# Patient Record
Sex: Male | Born: 1955 | Race: White | Hispanic: No | Marital: Married | Smoking: Former smoker
Health system: Southern US, Community
[De-identification: ages and names within clinical notes are randomized; demographics above are authoritative.]

## PROBLEM LIST (undated history)

## (undated) DIAGNOSIS — M1990 Unspecified osteoarthritis, unspecified site: Secondary | ICD-10-CM

## (undated) DIAGNOSIS — J9690 Respiratory failure, unspecified, unspecified whether with hypoxia or hypercapnia: Secondary | ICD-10-CM

## (undated) DIAGNOSIS — E119 Type 2 diabetes mellitus without complications: Secondary | ICD-10-CM

## (undated) DIAGNOSIS — R569 Unspecified convulsions: Secondary | ICD-10-CM

## (undated) DIAGNOSIS — K635 Polyp of colon: Secondary | ICD-10-CM

## (undated) DIAGNOSIS — N529 Male erectile dysfunction, unspecified: Secondary | ICD-10-CM

## (undated) DIAGNOSIS — E538 Deficiency of other specified B group vitamins: Secondary | ICD-10-CM

## (undated) DIAGNOSIS — E785 Hyperlipidemia, unspecified: Secondary | ICD-10-CM

## (undated) DIAGNOSIS — E291 Testicular hypofunction: Secondary | ICD-10-CM

## (undated) DIAGNOSIS — N289 Disorder of kidney and ureter, unspecified: Secondary | ICD-10-CM

## (undated) DIAGNOSIS — L0390 Cellulitis, unspecified: Secondary | ICD-10-CM

## (undated) DIAGNOSIS — N189 Chronic kidney disease, unspecified: Secondary | ICD-10-CM

## (undated) DIAGNOSIS — I8393 Asymptomatic varicose veins of bilateral lower extremities: Secondary | ICD-10-CM

## (undated) DIAGNOSIS — D649 Anemia, unspecified: Secondary | ICD-10-CM

## (undated) DIAGNOSIS — J329 Chronic sinusitis, unspecified: Secondary | ICD-10-CM

## (undated) DIAGNOSIS — G4730 Sleep apnea, unspecified: Secondary | ICD-10-CM

## (undated) DIAGNOSIS — H269 Unspecified cataract: Secondary | ICD-10-CM

## (undated) DIAGNOSIS — N281 Cyst of kidney, acquired: Secondary | ICD-10-CM

## (undated) DIAGNOSIS — C801 Malignant (primary) neoplasm, unspecified: Secondary | ICD-10-CM

## (undated) DIAGNOSIS — M6282 Rhabdomyolysis: Secondary | ICD-10-CM

## (undated) DIAGNOSIS — I671 Cerebral aneurysm, nonruptured: Secondary | ICD-10-CM

## (undated) DIAGNOSIS — I10 Essential (primary) hypertension: Secondary | ICD-10-CM

## (undated) HISTORY — DX: Type 2 diabetes mellitus without complications: E11.9

## (undated) HISTORY — DX: Essential (primary) hypertension: I10

## (undated) HISTORY — PX: CEREBRAL ANGIOGRAM: SHX1326

---
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
## 1996-11-03 HISTORY — PX: PILONIDAL CYST EXCISION: SHX744

## 2005-12-16 ENCOUNTER — Encounter: Admission: RE | Admit: 2005-12-16 | Discharge: 2005-12-16 | Payer: Self-pay | Admitting: Internal Medicine

## 2012-08-24 ENCOUNTER — Other Ambulatory Visit: Payer: Self-pay

## 2012-08-24 DIAGNOSIS — I872 Venous insufficiency (chronic) (peripheral): Secondary | ICD-10-CM

## 2012-09-03 ENCOUNTER — Encounter: Payer: Self-pay | Admitting: Vascular Surgery

## 2012-09-06 ENCOUNTER — Ambulatory Visit (INDEPENDENT_AMBULATORY_CARE_PROVIDER_SITE_OTHER): Payer: BC Managed Care – PPO | Admitting: Vascular Surgery

## 2012-09-06 ENCOUNTER — Encounter: Payer: Self-pay | Admitting: Vascular Surgery

## 2012-09-06 ENCOUNTER — Encounter (INDEPENDENT_AMBULATORY_CARE_PROVIDER_SITE_OTHER): Payer: BC Managed Care – PPO | Admitting: *Deleted

## 2012-09-06 VITALS — BP 144/79 | HR 66 | Resp 16 | Ht 72.0 in | Wt 336.0 lb

## 2012-09-06 DIAGNOSIS — I83893 Varicose veins of bilateral lower extremities with other complications: Secondary | ICD-10-CM

## 2012-09-06 DIAGNOSIS — I872 Venous insufficiency (chronic) (peripheral): Secondary | ICD-10-CM

## 2012-09-06 NOTE — Progress Notes (Signed)
Subjective:     Patient ID: Lawrence [name], male   DOB: [birth year], 56 y.o.   MRN: [id number]  HPI this 56 year old male is referred for severe venous insufficiency of both lower extremities. He has had swelling and darkening of the skin over the past 7 or 8 years with a history of at least 3 stasis ulcers in the left lower leg. He had laser ablation performed in [place] in 2008 and the most recent ulcer healed. He has continued to have progression of severe dark thick skin in the left leg and also in the right leg but to a slightly lesser degree. He has chronic swelling in both ankles. He does not elevate his legs at night nor were stockings during the day. He has no history of DVT, thrombophlebitis, bleeding, or bulging varicosities. He has been having aching, throbbing, and burning discomfort in both legs as the day progresses this worsens. He is very concerned about the progression of his skin changes.  Past Medical History  Diagnosis Date  . Hypertension   . Diabetes mellitus without complication     History  Substance Use Topics  . Smoking status: Former Smoker    Quit date: 12/04/2004  . Smokeless tobacco: Not on file  . Alcohol Use: No    Family History  Problem Relation Age of Onset  . Cancer Father   . Hyperlipidemia Brother     No Known Allergies  Current outpatient prescriptions:amoxicillin-clavulanate (AUGMENTIN) 875-125 MG per tablet, , Disp: , Rfl: ;  Blood Glucose Monitoring Suppl (ONE TOUCH ULTRA 2) W/DEVICE KIT, , Disp: , Rfl: ;  cyanocobalamin (,VITAMIN B-12,) 1000 MCG/ML injection, , Disp: , Rfl: ;  fenofibrate 160 MG tablet, , Disp: , Rfl: ;  Lancets (ONETOUCH ULTRASOFT) lancets, , Disp: , Rfl: ;  mupirocin ointment (BACTROBAN) 2 %, , Disp: , Rfl:  olmesartan (BENICAR) 20 MG tablet, Take 20 mg by mouth daily., Disp: , Rfl: ;  ONE TOUCH ULTRA TEST test strip, , Disp: , Rfl: ;  Prenatal w/o A Vit-Fe Fum-FA (PRENATAL-U) 106.5-1 MG CAPS, , Disp: , Rfl:   BP 144/79   Pulse 66  Resp 16  Ht 6' (1.829 m)  Wt 336 lb (152.409 kg)  BMI 45.57 kg/m2  Body mass index is 45.57 kg/(m^2).          Review of Systems denies chest pain, dyspnea on exertion, PND, orthopnea, lateralizing weakness, aphasia, or hemoptysis. 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 No active ulcers noted. No bulging varicosities noted. Neurologic normal Lower extremities 3+ femoral and dorsalis pedis pulses palpable bilaterally with 1-2+ edema bilaterally.  Today I ordered bilateral venous duplex exam which are reviewed and interpreted. Right leg has gross reflux throughout the great saphenous systems with no reflux in the small saphenous system and no deep venous reflux. Left leg has gross reflux in the  anterior accessory branch of the great saphenous vein to the mid thigh supplying varicosities which are in the distal thigh. The great saphenous vein on the left is absent the left small saphenous vein has no reflux in the left deep system has no reflux.     Assessment:     Severe bilateral venous insufficiency with multiple stasis ulcers by history in left leg and history of left great saphenous vein ablation in 2008. Patient now has severe reflux and anterior accessory branch left great saphenous  vein and in the right great saphenous vein with severe venous hypertension, skin changes, history of stasis ulcer,. Also with symptoms of pain affecting patient's daily living and ability to work    Plan:     #1 long-leg elastic compression stockings 20-30 mm gradient #2 elevate legs during the day frequently at night  #3 ibuprofen on a daily basis #4 return in 3 months. If not dramatic change in skin character patient needs 1 laser ablation right great saphenous vein followed by #2 laser ablation of anterior accessory branch left great saphenous vein

## 2012-12-06 ENCOUNTER — Encounter: Payer: Self-pay | Admitting: Vascular Surgery

## 2012-12-07 ENCOUNTER — Ambulatory Visit: Payer: BC Managed Care – PPO | Admitting: Vascular Surgery

## 2012-12-13 ENCOUNTER — Encounter: Payer: Self-pay | Admitting: Vascular Surgery

## 2012-12-14 ENCOUNTER — Ambulatory Visit (INDEPENDENT_AMBULATORY_CARE_PROVIDER_SITE_OTHER): Payer: BC Managed Care – PPO | Admitting: Vascular Surgery

## 2012-12-14 ENCOUNTER — Encounter: Payer: Self-pay | Admitting: Vascular Surgery

## 2012-12-14 VITALS — BP 152/87 | HR 62 | Resp 18 | Ht 72.0 in | Wt 330.0 lb

## 2012-12-14 DIAGNOSIS — I83893 Varicose veins of bilateral lower extremities with other complications: Secondary | ICD-10-CM | POA: Insufficient documentation

## 2012-12-14 DIAGNOSIS — I83219 Varicose veins of right lower extremity with both ulcer of unspecified site and inflammation: Secondary | ICD-10-CM | POA: Insufficient documentation

## 2012-12-14 DIAGNOSIS — L97919 Non-pressure chronic ulcer of unspecified part of right lower leg with unspecified severity: Secondary | ICD-10-CM

## 2012-12-14 DIAGNOSIS — L97929 Non-pressure chronic ulcer of unspecified part of left lower leg with unspecified severity: Secondary | ICD-10-CM

## 2012-12-14 NOTE — Progress Notes (Signed)
Subjective:     Patient ID: Lawrence [name], male   DOB: 12-Oct-1956, 57 y.o.   MRN: [id number]  HPIthis 57 year old male returns for continued followup regarding his severe venous insufficiency of both legs. He has a history of stasis ulcers in the left ankle most recent of which was 2 years ago. He has had severe shortness and thickening of the skin of both legs which is worsened over the past several years. The last 3 months he is were long leg elastic compression stockings 20-30 mm gradient and dried elevation and ibuprofen. He continues to have aching throbbing and burning discomfort and chronic edema. He has had no new active ulcers. He has no history of DVT or thrombophlebitis.  Past Medical History  Diagnosis Date  . Hypertension   . Diabetes mellitus without complication     History  Substance Use Topics  . Smoking status: Former Smoker    Quit date: 12/04/2004  . Smokeless tobacco: Not on file  . Alcohol Use: No    Family History  Problem Relation Age of Onset  . Cancer Father   . Hyperlipidemia Brother     No Known Allergies  Current outpatient prescriptions:Blood Glucose Monitoring Suppl (ONE TOUCH ULTRA 2) W/DEVICE KIT, , Disp: , Rfl: ;  fenofibrate 160 MG tablet, , Disp: , Rfl: ;  Lancets (ONETOUCH ULTRASOFT) lancets, , Disp: , Rfl: ;  mupirocin ointment (BACTROBAN) 2 %, , Disp: , Rfl: ;  ONE TOUCH ULTRA TEST test strip, , Disp: , Rfl: ;  Prenatal w/o A Vit-Fe Fum-FA (PRENATAL-U) 106.5-1 MG CAPS, , Disp: , Rfl:  amoxicillin-clavulanate (AUGMENTIN) 875-125 MG per tablet, , Disp: , Rfl: ;  cyanocobalamin (,VITAMIN B-12,) 1000 MCG/ML injection, , Disp: , Rfl: ;  olmesartan (BENICAR) 20 MG tablet, Take 20 mg by mouth daily., Disp: , Rfl:   BP 152/87  Pulse 62  Resp 18  Ht 6' (1.829 m)  Wt 330 lb (149.687 kg)  BMI 44.75 kg/m2  Body mass index is 44.75 kg/(m^2).           Review of Systems Denies chest pain, dyspnea on exertion, PND, orthopnea, ounces,  claudication to      Objective:   Physical Examblood pressure 150/87 heart rate 62 respirations 18 General well-developed well-nourished male no apparent stress alert and oriented x3 Lungs no rhonchi or wheezing Both lower extremities with severe hyperpigmentation and thickening of the skin from mid calf distally with chronic 1+ edema no active ulcers. 3+ dorsalis pedis pulses palpable bilaterally.  Venous duplex exam at last visit revealed gross reflux throughout right great saphenous vein. Left leg has gross reflux throughout anterior accessory branch left great saphenous vein. Left great saphenous vein has previously been closed with ablation procedure in 2008     Assessment:     Severe bilateral venous insufficiency with gross reflux right great saphenous vein and anterior accessory branch left great saphenous vein with chronic edema and severe skin changes and history of stasis ulcers left leg     Plan:     Patient needs #1 laser ablation right great saphenous vein followed by #2 laser ablation anterior accessory branch left great saphenous veinto prevent further ulcerations and stabilize severe skin changes and relieved symptoms Will proceed with precertification to perform this in the near future

## 2012-12-18 ENCOUNTER — Other Ambulatory Visit: Payer: Self-pay

## 2012-12-20 ENCOUNTER — Other Ambulatory Visit: Payer: Self-pay | Admitting: *Deleted

## 2012-12-20 DIAGNOSIS — I83893 Varicose veins of bilateral lower extremities with other complications: Secondary | ICD-10-CM

## 2013-01-10 ENCOUNTER — Other Ambulatory Visit: Payer: Self-pay

## 2013-01-10 ENCOUNTER — Ambulatory Visit (INDEPENDENT_AMBULATORY_CARE_PROVIDER_SITE_OTHER): Payer: BC Managed Care – PPO | Admitting: Vascular Surgery

## 2013-01-10 ENCOUNTER — Encounter: Payer: Self-pay | Admitting: Vascular Surgery

## 2013-01-10 VITALS — BP 178/82 | HR 78 | Resp 18 | Ht 72.0 in | Wt 330.0 lb

## 2013-01-10 DIAGNOSIS — I83893 Varicose veins of bilateral lower extremities with other complications: Secondary | ICD-10-CM

## 2013-01-10 MED ORDER — HYDROCODONE-ACETAMINOPHEN 5-325 MG PO TABS: 1.0000 | ORAL_TABLET | Freq: Four times a day (QID) | ORAL | Status: DC | PRN

## 2013-01-10 NOTE — Progress Notes (Signed)
Pt. Had a laser ablation done today, and was given Rx for Tylox.  Pharmacy advised pt. That Tylox is not manufactured anymore.  Discussed w/ Dr. Hart [place].  Rec'd. v.o. for Hydrocodone/Acetaminophen 5/325 mg/ # 20/ no refills.  Phoned-in new Rx to pharmacy.

## 2013-01-10 NOTE — Progress Notes (Signed)
Laser Ablation Procedure      Date: 01/10/2013    Lawrence [name] DOB:05-10-1956  Consent signed: Yes  Surgeon:J.D. Hart Rochester  Procedure: Laser Ablation: right Greater Saphenous Vein  BP 178/82  Pulse 78  Resp 18  Ht 6' (1.829 m)  Wt 330 lb (149.687 kg)  BMI 44.75 kg/m2  Start time: 1:00   End time: 1:40  Tumescent Anesthesia: 400 cc 0.9% NaCl with 50 cc Lidocaine HCL with 1% Epi and 15 cc 8.4% NaHCO3  Local Anesthesia: 5 cc Lidocaine HCL and NaHCO3 (ratio 2:1)  Pulsed mode:yes Watts 15 Seconds 1 Pulses:1 Total Pulses:161 Total Energy: 2413 Total Time: 2:40       Patient tolerated procedure well: Yes  Notes:   Description of Procedure:  After marking the course of the saphenous vein and the secondary varicosities in the standing position, the patient was placed on the operating table in the supine position, and the right leg was prepped and draped in sterile fashion. Local anesthetic was administered, and under ultrasound guidance the saphenous vein was accessed with a micro needle and guide wire; then the micro puncture sheath was placed. A guide wire was inserted to the saphenofemoral junction, followed by a 5 french sheath.  The position of the sheath and then the laser fiber below the junction was confirmed using the ultrasound and visualization of the aiming beam.  Tumescent anesthesia was administered along the course of the saphenous vein using ultrasound guidance. Protective laser glasses were placed on the patient, and the laser was fired at 15 watt pulsed mode advancing 1-2 mm per sec.  For a total of 2413 joules.  A steri strip was applied to the puncture site.    ABD pads and thigh high compression stockings were applied.  Ace wrap bandages were applied over the phlebectomy sites and at the top of the saphenofemoral junction.  Blood loss was less than 15 cc.  The patient ambulated out of the operating room having tolerated the procedure well.

## 2013-01-10 NOTE — Progress Notes (Signed)
Subjective:     Patient ID: Lawrence [name], male   DOB: 05/28/1956, 57 y.o.   MRN: [id number]  HPIthis 57 year old male with laser ablation of the right great saphenous vein performed under local tumescent anesthesia venous hypertension with edema right leg. A total of 2413 J of energy was utilized. He tolerated the procedure well.   Review of Systems     Objective:   Physical Exam BP 178/82  Pulse 78  Resp 18  Ht 6' (1.829 m)  Wt 330 lb (149.687 kg)  BMI 44.75 kg/m2        Assessment:     Well-tolerated laser ablation right great saphenous vein performed under local tumescent anesthesia    Plan:     Return in one week for venous duplex exam right leg to confirm closure right great saphenous vein

## 2013-01-11 ENCOUNTER — Telehealth: Payer: Self-pay | Admitting: *Deleted

## 2013-01-11 NOTE — Telephone Encounter (Signed)
Patient's discomfort from the procedure has eased up. He is following all instructions. Reminded him of is fu appt with [place] next week.

## 2013-01-17 ENCOUNTER — Other Ambulatory Visit: Payer: BC Managed Care – PPO | Admitting: Vascular Surgery

## 2013-01-18 ENCOUNTER — Encounter (INDEPENDENT_AMBULATORY_CARE_PROVIDER_SITE_OTHER): Payer: BC Managed Care – PPO | Admitting: *Deleted

## 2013-01-18 ENCOUNTER — Ambulatory Visit (INDEPENDENT_AMBULATORY_CARE_PROVIDER_SITE_OTHER): Payer: BC Managed Care – PPO | Admitting: Vascular Surgery

## 2013-01-18 ENCOUNTER — Encounter: Payer: Self-pay | Admitting: Vascular Surgery

## 2013-01-18 VITALS — BP 145/62 | HR 66 | Resp 18 | Ht 72.0 in | Wt 330.0 lb

## 2013-01-18 DIAGNOSIS — I83893 Varicose veins of bilateral lower extremities with other complications: Secondary | ICD-10-CM

## 2013-01-18 NOTE — Progress Notes (Signed)
Subjective:     Patient ID: Lawrence [name], male   DOB: [birth year], 57 y.o.   MRN: [id number]  HPIthis 57 year old male returns 1 week post laser ablation right great saphenous vein for venous hypertension with history of venous stasis ulcer. He has had some mild-to-moderate discomfort in the thigh. He has been wearing his elastic compression stocking and taking ibuprofen as instructed. He continues to have discomfort in the contralateral left leg from bulging varicosities.  Past Medical History  Diagnosis Date  . Hypertension   . Diabetes mellitus without complication     History  Substance Use Topics  . Smoking status: Former Smoker    Quit date: 12/04/2004  . Smokeless tobacco: Not on file  . Alcohol Use: No    Family History  Problem Relation Age of Onset  . Cancer Father   . Hyperlipidemia Brother     No Known Allergies  Current outpatient prescriptions:amoxicillin-clavulanate (AUGMENTIN) 875-125 MG per tablet, , Disp: , Rfl: ;  Blood Glucose Monitoring Suppl (ONE TOUCH ULTRA 2) W/DEVICE KIT, , Disp: , Rfl: ;  cyanocobalamin (,VITAMIN B-12,) 1000 MCG/ML injection, , Disp: , Rfl: ;  fenofibrate 160 MG tablet, , Disp: , Rfl: ;  HYDROcodone-acetaminophen (NORCO) 5-325 MG per tablet, Take 1 tablet by mouth every 6 (six) hours as needed for pain., Disp: 20 tablet, Rfl: 0 Lancets (ONETOUCH ULTRASOFT) lancets, , Disp: , Rfl: ;  mupirocin ointment (BACTROBAN) 2 %, , Disp: , Rfl: ;  olmesartan (BENICAR) 20 MG tablet, Take 20 mg by mouth daily., Disp: , Rfl: ;  ONE TOUCH ULTRA TEST test strip, , Disp: , Rfl: ;  Prenatal w/o A Vit-Fe Fum-FA (PRENATAL-U) 106.5-1 MG CAPS, , Disp: , Rfl:   BP 145/62  Pulse 66  Resp 18  Ht 6' (1.829 m)  Wt 330 lb (149.687 kg)  BMI 44.75 kg/m2  Body mass index is 44.75 kg/(m^2).         Review of Systemsdenies chest pain, dyspnea on exertion, PND, orthopnea, hemoptysis, claudication     Objective:   Physical Examblood pressure 145/62 heart rate  66 respirations 18 General well-developed well-nourished male no apparent stress alert and oriented x3 Lungs no rhonchi or wheezing Cardiovascular regular rhythm no murmurs Right leg with mild to moderate discomfort along the course of the great saphenous vein from proximal calf the saphenofemoral junction. No ecchymosis noted mild erythema. To 3+ dorsalis pedis pulse palpable. Hyperpigmentation unchanged lower leg.  Today I ordered right lower extremity venous duplex exam which I reviewed and interpreted. Great saphenous veins been totally closed ablation and there is no DVT     Assessment:     Successful ablation right great saphenous vein for venous hypertension and severe skin changes distally right lower extremity     Plan:     Continue elastic compression stockings bilaterally-if left leg symptoms worsen or skin changes occur patient should return for further followup otherwise we'll see on a when necessary basis

## 2013-09-08 ENCOUNTER — Other Ambulatory Visit: Payer: Self-pay

## 2013-12-22 ENCOUNTER — Other Ambulatory Visit (HOSPITAL_COMMUNITY): Payer: Self-pay | Admitting: Urology

## 2013-12-22 DIAGNOSIS — D49519 Neoplasm of unspecified behavior of unspecified kidney: Secondary | ICD-10-CM

## 2013-12-30 ENCOUNTER — Ambulatory Visit (HOSPITAL_COMMUNITY)
Admission: RE | Admit: 2013-12-30 | Discharge: 2013-12-30 | Disposition: A | Discharge status: Home / Self Care | Payer: BC Managed Care – PPO | Source: Ambulatory Visit | Attending: Urology | Admitting: Urology

## 2013-12-30 DIAGNOSIS — D4959 Neoplasm of unspecified behavior of other genitourinary organ: Secondary | ICD-10-CM | POA: Insufficient documentation

## 2013-12-30 DIAGNOSIS — D49519 Neoplasm of unspecified behavior of unspecified kidney: Secondary | ICD-10-CM

## 2014-01-02 LAB — POCT I-STAT, CHEM 8
BUN: 19 mg/dL (ref 6–23)
Calcium, Ion: 1.24 mmol/L — ABNORMAL HIGH (ref 1.12–1.23)
Chloride: 102 mEq/L (ref 96–112)
Creatinine, Ser: 1.4 mg/dL — ABNORMAL HIGH (ref 0.50–1.35)
Glucose, Bld: 93 mg/dL (ref 70–99)
HCT: 47 % (ref 39.0–52.0)
Hemoglobin: 16 g/dL (ref 13.0–17.0)
Potassium: 3.9 mEq/L (ref 3.7–5.3)
Sodium: 142 mEq/L (ref 137–147)
TCO2: 27 mmol/L (ref 0–100)

## 2014-01-11 ENCOUNTER — Ambulatory Visit (HOSPITAL_COMMUNITY)
Admission: RE | Admit: 2014-01-11 | Discharge: 2014-01-11 | Disposition: A | Discharge status: Home / Self Care | Payer: BC Managed Care – PPO | Source: Ambulatory Visit | Attending: Urology | Admitting: Urology

## 2014-01-11 ENCOUNTER — Other Ambulatory Visit (HOSPITAL_COMMUNITY): Payer: Self-pay | Admitting: Urology

## 2014-01-11 DIAGNOSIS — D49519 Neoplasm of unspecified behavior of unspecified kidney: Secondary | ICD-10-CM

## 2014-01-11 DIAGNOSIS — Q618 Other cystic kidney diseases: Secondary | ICD-10-CM | POA: Insufficient documentation

## 2014-01-11 DIAGNOSIS — N289 Disorder of kidney and ureter, unspecified: Secondary | ICD-10-CM | POA: Insufficient documentation

## 2014-01-11 LAB — CREATININE, SERUM
Creatinine, Ser: 1.32 mg/dL (ref 0.50–1.35)
GFR calc Af Amer: 69 mL/min — ABNORMAL LOW (ref >90)
GFR calc non Af Amer: 59 mL/min — ABNORMAL LOW (ref >90)

## 2014-01-11 MED ORDER — GADOBENATE DIMEGLUMINE 529 MG/ML IV SOLN
20.0000 mL | Freq: Once | INTRAVENOUS | Status: AC
  Administered 2014-01-11: 10 mL via INTRAVENOUS

## 2014-06-29 ENCOUNTER — Other Ambulatory Visit (HOSPITAL_COMMUNITY): Payer: Self-pay | Admitting: Urology

## 2014-06-29 DIAGNOSIS — D49519 Neoplasm of unspecified behavior of unspecified kidney: Secondary | ICD-10-CM

## 2014-07-21 ENCOUNTER — Ambulatory Visit (HOSPITAL_COMMUNITY): Payer: BC Managed Care – PPO

## 2014-11-03 DIAGNOSIS — N281 Cyst of kidney, acquired: Secondary | ICD-10-CM

## 2014-11-03 DIAGNOSIS — N289 Disorder of kidney and ureter, unspecified: Secondary | ICD-10-CM

## 2014-11-03 DIAGNOSIS — N189 Chronic kidney disease, unspecified: Secondary | ICD-10-CM

## 2014-11-03 HISTORY — DX: Disorder of kidney and ureter, unspecified: N28.9

## 2014-11-03 HISTORY — DX: Chronic kidney disease, unspecified: N18.9

## 2014-11-03 HISTORY — DX: Cyst of kidney, acquired: N28.1

## 2015-05-01 ENCOUNTER — Other Ambulatory Visit (HOSPITAL_COMMUNITY): Payer: Self-pay | Admitting: Urology

## 2015-05-01 DIAGNOSIS — D49519 Neoplasm of unspecified behavior of unspecified kidney: Secondary | ICD-10-CM

## 2015-05-21 ENCOUNTER — Ambulatory Visit (HOSPITAL_COMMUNITY)
Admission: RE | Admit: 2015-05-21 | Discharge: 2015-05-21 | Disposition: A | Discharge status: Home / Self Care | Payer: BLUE CROSS/BLUE SHIELD | Source: Ambulatory Visit | Attending: Urology | Admitting: Urology

## 2015-05-21 DIAGNOSIS — D495 Neoplasm of unspecified behavior of other genitourinary organs: Secondary | ICD-10-CM | POA: Insufficient documentation

## 2015-05-21 DIAGNOSIS — D49519 Neoplasm of unspecified behavior of unspecified kidney: Secondary | ICD-10-CM

## 2015-05-21 LAB — CREATININE, SERUM
Creatinine, Ser: 1.31 mg/dL — ABNORMAL HIGH (ref 0.61–1.24)
GFR calc Af Amer: >60 mL/min (ref >60)
GFR calc non Af Amer: 59 mL/min — ABNORMAL LOW (ref >60)

## 2015-07-31 ENCOUNTER — Other Ambulatory Visit: Payer: Self-pay | Admitting: Urology

## 2015-08-08 ENCOUNTER — Other Ambulatory Visit: Payer: Self-pay | Admitting: Urology

## 2015-08-30 NOTE — Patient Instructions (Addendum)
Shakil Dirk  08/30/2015   Your procedure is scheduled on: Friday 09/07/2015  Report to [facility] Main  Entrance take [place]  elevators to 3rd floor to  [place] at  [place]  AM.  Call this number if you have problems the morning of surgery [phone]   Remember: ONLY 1 PERSON MAY GO WITH YOU TO SHORT STAY TO GET  READY MORNING OF [place].   Do not eat food or drink liquids :After Midnight.     Take these medicines the morning of surgery with A SIP OF WATER: Norvasc (Amlodipine), Metoprolol              DO NOT TAKE ANY DIABETIC MEDICATIONS DAY OF YOUR SURGERY               Please bring CPAP tubing and mask on day of surgery                               You may not have any metal on your body including hair pins and              piercings  Do not wear jewelry, make-up, lotions, powders or perfumes, deodorant             Do not wear nail polish.  Do not shave  48 hours prior to surgery.              Men may shave face and neck.   Do not bring valuables to the hospital. [place].  Contacts, dentures or bridgework may not be worn into surgery.  Leave suitcase in the car. After surgery it may be brought to your room.     Special Instructions: practice deep breathing and leg exercises              Please read over the following fact sheets you were given: _____________________________________________________________________             [facility] - Preparing for Surgery Before surgery, you can play an important role.  Because skin is not sterile, your skin needs to be as free of germs as possible.  You can reduce the number of germs on your skin by washing with CHG (chlorahexidine gluconate) soap before surgery.  CHG is an antiseptic cleaner which kills germs and bonds with the skin to continue killing germs even after washing. Please DO NOT use if you have an allergy to CHG or antibacterial  soaps.  If your skin becomes reddened/irritated stop using the CHG and inform your nurse when you arrive at Short Stay. Do not shave (including legs and underarms) for at least 48 hours prior to the first CHG shower.  You may shave your face/neck. Please follow these instructions carefully:  1.  Shower with CHG Soap the night before surgery and the  morning of Surgery.  2.  If you choose to wash your hair, wash your hair first as usual with your  normal  shampoo.  3.  After you shampoo, rinse your hair and body thoroughly to remove the  shampoo.  4.  Use CHG as you would any other liquid soap.  You can apply chg directly  to the skin and wash                       Gently with a scrungie or clean washcloth.  5.  Apply the CHG Soap to your body ONLY FROM THE NECK DOWN.   Do not use on face/ open                           Wound or open sores. Avoid contact with eyes, ears mouth and genitals (private parts).                       Wash face,  Genitals (private parts) with your normal soap.             6.  Wash thoroughly, paying special attention to the area where your surgery  will be performed.  7.  Thoroughly rinse your body with warm water from the neck down.  8.  DO NOT shower/wash with your normal soap after using and rinsing off  the CHG Soap.                9.  Pat yourself dry with a clean towel.            10.  Wear clean pajamas.            11.  Place clean sheets on your bed the night of your first shower and do not  sleep with pets. Day of Surgery : Do not apply any lotions/deodorants the morning of surgery.  Please wear clean clothes to the hospital/surgery center.  FAILURE TO FOLLOW THESE INSTRUCTIONS MAY RESULT IN THE CANCELLATION OF YOUR SURGERY PATIENT SIGNATURE_________________________________  NURSE SIGNATURE__________________________________  ________________________________________________________________________  WHAT IS A BLOOD TRANSFUSION? Blood  Transfusion Information  A transfusion is the replacement of blood or some of its parts. Blood is made up of multiple cells which provide different functions.  Red blood cells carry oxygen and are used for blood loss replacement.  White blood cells fight against infection.  Platelets control bleeding.  Plasma helps clot blood.  Other blood products are available for specialized needs, such as hemophilia or other clotting disorders. BEFORE THE TRANSFUSION  Who gives blood for transfusions?   Healthy volunteers who are fully evaluated to make sure their blood is safe. This is blood bank blood. Transfusion therapy is the safest it has ever been in the practice of medicine. Before blood is taken from a donor, a complete history is taken to make sure that person has no history of diseases nor engages in risky social behavior (examples are intravenous drug use or sexual activity with multiple partners). The donor's travel history is screened to minimize risk of transmitting infections, such as malaria. The donated blood is tested for signs of infectious diseases, such as HIV and hepatitis. The blood is then tested to be sure it is compatible with you in order to minimize the chance of a transfusion reaction. If you or a relative donates blood, this is often done in anticipation of surgery and is not appropriate for emergency situations. It takes many days to process the donated blood. RISKS AND COMPLICATIONS Although transfusion therapy is very safe and saves many lives, the main dangers of transfusion include:   Getting an infectious disease.  Developing a transfusion reaction. This  is an allergic reaction to something in the blood you were given. Every precaution is taken to prevent this. The decision to have a blood transfusion has been considered carefully by your caregiver before blood is given. Blood is not given unless the benefits outweigh the risks. AFTER THE TRANSFUSION  Right after  receiving a blood transfusion, you will usually feel much better and more energetic. This is especially true if your red blood cells have gotten low (anemic). The transfusion raises the level of the red blood cells which carry oxygen, and this usually causes an energy increase.  The nurse administering the transfusion will monitor you carefully for complications. HOME CARE INSTRUCTIONS  No special instructions are needed after a transfusion. You may find your energy is better. Speak with your caregiver about any limitations on activity for underlying diseases you may have. SEEK MEDICAL CARE IF:   Your condition is not improving after your transfusion.  You develop redness or irritation at the intravenous (IV) site. SEEK IMMEDIATE MEDICAL CARE IF:  Any of the following symptoms occur over the next 12 hours:  Shaking chills.  You have a temperature by mouth above 102 F (38.9 C), not controlled by medicine.  Chest, back, or muscle pain.  People around you feel you are not acting correctly or are confused.  Shortness of breath or difficulty breathing.  Dizziness and fainting.  You get a rash or develop hives.  You have a decrease in urine output.  Your urine turns a dark color or changes to pink, red, or brown. Any of the following symptoms occur over the next 10 days:  You have a temperature by mouth above 102 F (38.9 C), not controlled by medicine.  Shortness of breath.  Weakness after normal activity.  The white part of the eye turns yellow (jaundice).  You have a decrease in the amount of urine or are urinating less often.  Your urine turns a dark color or changes to pink, red, or brown. Document Released: 10/17/2000 Document Revised: 01/12/2012 Document Reviewed: 06/05/2008 [facility] Patient Information 2014 [place], [place].  _______________________________________________________________________

## 2015-08-31 ENCOUNTER — Encounter (HOSPITAL_COMMUNITY)
Admission: RE | Admit: 2015-08-31 | Discharge: 2015-08-31 | Disposition: A | Discharge status: Home / Self Care | Payer: BLUE CROSS/BLUE SHIELD | Source: Ambulatory Visit | Attending: Urology | Admitting: Urology

## 2015-08-31 ENCOUNTER — Encounter (HOSPITAL_COMMUNITY): Payer: Self-pay

## 2015-08-31 DIAGNOSIS — N2889 Other specified disorders of kidney and ureter: Secondary | ICD-10-CM | POA: Diagnosis not present

## 2015-08-31 DIAGNOSIS — Z01818 Encounter for other preprocedural examination: Secondary | ICD-10-CM | POA: Diagnosis present

## 2015-08-31 HISTORY — DX: Unspecified osteoarthritis, unspecified site: M19.90

## 2015-08-31 HISTORY — DX: Chronic kidney disease, unspecified: N18.9

## 2015-08-31 LAB — COMPREHENSIVE METABOLIC PANEL
ALT: 42 U/L (ref 17–63)
AST: 31 U/L (ref 15–41)
Albumin: 4.1 g/dL (ref 3.5–5.0)
Alkaline Phosphatase: 46 U/L (ref 38–126)
Anion gap: 7 (ref 5–15)
BUN: 17 mg/dL (ref 6–20)
CO2: 27 mmol/L (ref 22–32)
Calcium: 9.7 mg/dL (ref 8.9–10.3)
Chloride: 106 mmol/L (ref 101–111)
Creatinine, Ser: 1.29 mg/dL — ABNORMAL HIGH (ref 0.61–1.24)
GFR calc Af Amer: >60 mL/min (ref >60)
GFR calc non Af Amer: 60 mL/min — ABNORMAL LOW (ref >60)
Glucose, Bld: 80 mg/dL (ref 65–99)
Potassium: 4.2 mmol/L (ref 3.5–5.1)
Sodium: 140 mmol/L (ref 135–145)
Total Bilirubin: 0.8 mg/dL (ref 0.3–1.2)
Total Protein: 7.3 g/dL (ref 6.5–8.1)

## 2015-08-31 LAB — CBC
HEMATOCRIT: 49.3 % (ref 39.0–52.0)
HEMOGLOBIN: 16.4 g/dL (ref 13.0–17.0)
MCH: 28.5 pg (ref 26.0–34.0)
MCHC: 33.3 g/dL (ref 30.0–36.0)
MCV: 85.7 fL (ref 78.0–100.0)
Platelets: 201 10*3/uL (ref 150–400)
RBC: 5.75 MIL/uL (ref 4.22–5.81)
RDW: 13.9 % (ref 11.5–15.5)
WBC: 9.3 10*3/uL (ref 4.0–10.5)

## 2015-08-31 LAB — ABO/RH: ABO/RH(D): O POS

## 2015-08-31 NOTE — Pre-Procedure Instructions (Signed)
Pt to have EKG done during pre-op visit r/t hx of HTN.

## 2015-09-02 LAB — URINE CULTURE

## 2015-09-06 MED ORDER — DEXTROSE 5 % IV SOLN
3.0000 g | INTRAVENOUS | Status: AC
  Administered 2015-09-07: 3 g via INTRAVENOUS
  Filled 2015-09-06 (×2): qty 3000

## 2015-09-07 ENCOUNTER — Inpatient Hospital Stay (HOSPITAL_COMMUNITY)
Admission: RE | Admit: 2015-09-07 | Discharge: 2015-09-13 | DRG: 687 | Disposition: A | Discharge status: Home Health | Payer: BLUE CROSS/BLUE SHIELD | Source: Ambulatory Visit | Attending: Urology | Admitting: Urology

## 2015-09-07 ENCOUNTER — Encounter (HOSPITAL_COMMUNITY): Payer: Self-pay | Admitting: *Deleted

## 2015-09-07 ENCOUNTER — Inpatient Hospital Stay (HOSPITAL_COMMUNITY): Payer: BLUE CROSS/BLUE SHIELD | Admitting: Certified Registered Nurse Anesthetist

## 2015-09-07 ENCOUNTER — Encounter (HOSPITAL_COMMUNITY)
Admission: RE | Disposition: A | Discharge status: Home Health | Payer: Self-pay | Source: Ambulatory Visit | Attending: Urology

## 2015-09-07 DIAGNOSIS — Z01812 Encounter for preprocedural laboratory examination: Secondary | ICD-10-CM

## 2015-09-07 DIAGNOSIS — C641 Malignant neoplasm of right kidney, except renal pelvis: Principal | ICD-10-CM | POA: Diagnosis present

## 2015-09-07 DIAGNOSIS — E876 Hypokalemia: Secondary | ICD-10-CM | POA: Diagnosis not present

## 2015-09-07 DIAGNOSIS — N179 Acute kidney failure, unspecified: Secondary | ICD-10-CM | POA: Diagnosis not present

## 2015-09-07 DIAGNOSIS — M6282 Rhabdomyolysis: Secondary | ICD-10-CM | POA: Diagnosis not present

## 2015-09-07 DIAGNOSIS — N183 Chronic kidney disease, stage 3 (moderate): Secondary | ICD-10-CM | POA: Diagnosis present

## 2015-09-07 DIAGNOSIS — E119 Type 2 diabetes mellitus without complications: Secondary | ICD-10-CM | POA: Diagnosis present

## 2015-09-07 DIAGNOSIS — K567 Ileus, unspecified: Secondary | ICD-10-CM | POA: Diagnosis not present

## 2015-09-07 DIAGNOSIS — E873 Alkalosis: Secondary | ICD-10-CM | POA: Diagnosis not present

## 2015-09-07 DIAGNOSIS — Z87891 Personal history of nicotine dependence: Secondary | ICD-10-CM | POA: Diagnosis not present

## 2015-09-07 DIAGNOSIS — N2889 Other specified disorders of kidney and ureter: Secondary | ICD-10-CM | POA: Diagnosis present

## 2015-09-07 DIAGNOSIS — E7800 Pure hypercholesterolemia, unspecified: Secondary | ICD-10-CM | POA: Diagnosis present

## 2015-09-07 DIAGNOSIS — E669 Obesity, unspecified: Secondary | ICD-10-CM | POA: Diagnosis present

## 2015-09-07 DIAGNOSIS — Z8042 Family history of malignant neoplasm of prostate: Secondary | ICD-10-CM

## 2015-09-07 DIAGNOSIS — E875 Hyperkalemia: Secondary | ICD-10-CM | POA: Diagnosis not present

## 2015-09-07 DIAGNOSIS — I129 Hypertensive chronic kidney disease with stage 1 through stage 4 chronic kidney disease, or unspecified chronic kidney disease: Secondary | ICD-10-CM | POA: Diagnosis present

## 2015-09-07 DIAGNOSIS — Z800 Family history of malignant neoplasm of digestive organs: Secondary | ICD-10-CM | POA: Diagnosis not present

## 2015-09-07 DIAGNOSIS — M62838 Other muscle spasm: Secondary | ICD-10-CM | POA: Diagnosis not present

## 2015-09-07 DIAGNOSIS — Z79899 Other long term (current) drug therapy: Secondary | ICD-10-CM | POA: Diagnosis not present

## 2015-09-07 DIAGNOSIS — I739 Peripheral vascular disease, unspecified: Secondary | ICD-10-CM | POA: Diagnosis present

## 2015-09-07 DIAGNOSIS — I10 Essential (primary) hypertension: Secondary | ICD-10-CM | POA: Diagnosis present

## 2015-09-07 DIAGNOSIS — Z6841 Body Mass Index (BMI) 40.0 and over, adult: Secondary | ICD-10-CM | POA: Diagnosis not present

## 2015-09-07 HISTORY — PX: ROBOTIC ASSITED PARTIAL NEPHRECTOMY: SHX6087

## 2015-09-07 LAB — BASIC METABOLIC PANEL
ANION GAP: 6 (ref 5–15)
BUN: 21 mg/dL — ABNORMAL HIGH (ref 6–20)
CO2: 25 mmol/L (ref 22–32)
Calcium: 8 mg/dL — ABNORMAL LOW (ref 8.9–10.3)
Chloride: 105 mmol/L (ref 101–111)
Creatinine, Ser: 2.19 mg/dL — ABNORMAL HIGH (ref 0.61–1.24)
GFR, EST AFRICAN AMERICAN: 37 mL/min — AB (ref >60)
GFR, EST NON AFRICAN AMERICAN: 32 mL/min — AB (ref >60)
GLUCOSE: 165 mg/dL — AB (ref 65–99)
POTASSIUM: 5.8 mmol/L — AB (ref 3.5–5.1)
Sodium: 136 mmol/L (ref 135–145)

## 2015-09-07 LAB — GLUCOSE, CAPILLARY
GLUCOSE-CAPILLARY: 99 mg/dL (ref 65–99)
Glucose-Capillary: 140 mg/dL — ABNORMAL HIGH (ref 65–99)
Glucose-Capillary: 155 mg/dL — ABNORMAL HIGH (ref 65–99)
Glucose-Capillary: 155 mg/dL — ABNORMAL HIGH (ref 65–99)
Glucose-Capillary: 150 mg/dL — ABNORMAL HIGH (ref 65–99)

## 2015-09-07 LAB — CK: Total CK: >50000 U/L — ABNORMAL HIGH (ref 49–397)

## 2015-09-07 LAB — HEMOGLOBIN AND HEMATOCRIT, BLOOD
HCT: 47.9 % (ref 39.0–52.0)
HEMOGLOBIN: 15.6 g/dL (ref 13.0–17.0)

## 2015-09-07 SURGERY — ROBOTIC ASSITED PARTIAL NEPHRECTOMY
Anesthesia: General | Laterality: Right

## 2015-09-07 MED ORDER — HYDROMORPHONE HCL 1 MG/ML IJ SOLN
INTRAMUSCULAR | Status: AC
  Filled 2015-09-07: qty 1

## 2015-09-07 MED ORDER — FENTANYL CITRATE (PF) 250 MCG/5ML IJ SOLN
INTRAMUSCULAR | Status: AC
  Filled 2015-09-07: qty 25

## 2015-09-07 MED ORDER — ROCURONIUM BROMIDE 100 MG/10ML IV SOLN
INTRAVENOUS | Status: DC | PRN
  Administered 2015-09-07: 50 mg via INTRAVENOUS
  Administered 2015-09-07 (×2): 20 mg via INTRAVENOUS
  Administered 2015-09-07: 10 mg via INTRAVENOUS
  Administered 2015-09-07: 20 mg via INTRAVENOUS
  Administered 2015-09-07: 10 mg via INTRAVENOUS
  Administered 2015-09-07: 20 mg via INTRAVENOUS
  Administered 2015-09-07 (×2): 10 mg via INTRAVENOUS

## 2015-09-07 MED ORDER — SODIUM BICARBONATE 8.4 % IV SOLN
INTRAVENOUS | Status: DC
  Administered 2015-09-07 – 2015-09-10 (×9): via INTRAVENOUS
  Filled 2015-09-07 (×9): qty 150

## 2015-09-07 MED ORDER — LIDOCAINE HCL (CARDIAC) 20 MG/ML IV SOLN
INTRAVENOUS | Status: AC
  Filled 2015-09-07: qty 5

## 2015-09-07 MED ORDER — LABETALOL HCL 5 MG/ML IV SOLN
10.0000 mg | INTRAVENOUS | Status: DC | PRN
  Administered 2015-09-07: 10 mg via INTRAVENOUS

## 2015-09-07 MED ORDER — OXYCODONE HCL 5 MG PO TABS
5.0000 mg | ORAL_TABLET | ORAL | Status: DC | PRN
  Administered 2015-09-07 – 2015-09-08 (×2): 5 mg via ORAL
  Filled 2015-09-07 (×2): qty 1

## 2015-09-07 MED ORDER — MIDAZOLAM HCL 5 MG/5ML IJ SOLN
INTRAMUSCULAR | Status: DC | PRN
  Administered 2015-09-07: 2 mg via INTRAVENOUS

## 2015-09-07 MED ORDER — HYDROMORPHONE HCL 2 MG/ML IJ SOLN
INTRAMUSCULAR | Status: AC
  Filled 2015-09-07: qty 1

## 2015-09-07 MED ORDER — ONDANSETRON HCL 4 MG/2ML IJ SOLN
INTRAMUSCULAR | Status: AC
  Filled 2015-09-07: qty 2

## 2015-09-07 MED ORDER — METOPROLOL SUCCINATE ER 50 MG PO TB24
50.0000 mg | ORAL_TABLET | Freq: Every day | ORAL | Status: DC
  Administered 2015-09-08 – 2015-09-13 (×6): 50 mg via ORAL
  Filled 2015-09-07 (×6): qty 1

## 2015-09-07 MED ORDER — GLYCOPYRROLATE 0.2 MG/ML IJ SOLN
INTRAMUSCULAR | Status: DC | PRN
  Administered 2015-09-07: 0.2 mg via INTRAVENOUS

## 2015-09-07 MED ORDER — BUPIVACAINE-EPINEPHRINE (PF) 0.25% -1:200000 IJ SOLN
INTRAMUSCULAR | Status: AC
  Filled 2015-09-07: qty 30

## 2015-09-07 MED ORDER — ONDANSETRON HCL 4 MG/2ML IJ SOLN
4.0000 mg | INTRAMUSCULAR | Status: DC | PRN
  Administered 2015-09-08 – 2015-09-09 (×3): 4 mg via INTRAVENOUS
  Filled 2015-09-07 (×3): qty 2

## 2015-09-07 MED ORDER — DEXTROSE 5 % IV SOLN
3.0000 g | Freq: Once | INTRAVENOUS | Status: DC
  Filled 2015-09-07: qty 3000

## 2015-09-07 MED ORDER — GLYCOPYRROLATE 0.2 MG/ML IJ SOLN
INTRAMUSCULAR | Status: AC
  Filled 2015-09-07: qty 3

## 2015-09-07 MED ORDER — SUGAMMADEX SODIUM 500 MG/5ML IV SOLN
INTRAVENOUS | Status: AC
  Filled 2015-09-07: qty 5

## 2015-09-07 MED ORDER — ACETAMINOPHEN 10 MG/ML IV SOLN
1000.0000 mg | Freq: Once | INTRAVENOUS | Status: AC
  Administered 2015-09-07: 1000 mg via INTRAVENOUS
  Filled 2015-09-07 (×2): qty 100

## 2015-09-07 MED ORDER — LIDOCAINE HCL (CARDIAC) 20 MG/ML IV SOLN
INTRAVENOUS | Status: DC | PRN
  Administered 2015-09-07: 100 mg via INTRAVENOUS

## 2015-09-07 MED ORDER — ACETAMINOPHEN 500 MG PO TABS: 1000.0000 mg | ORAL_TABLET | Freq: Four times a day (QID) | ORAL | Status: DC

## 2015-09-07 MED ORDER — SODIUM CHLORIDE 0.9 % IJ SOLN
INTRAMUSCULAR | Status: DC | PRN
  Administered 2015-09-07 (×2): 20 mL

## 2015-09-07 MED ORDER — SUCCINYLCHOLINE CHLORIDE 20 MG/ML IJ SOLN
INTRAMUSCULAR | Status: DC | PRN
  Administered 2015-09-07: 140 mg via INTRAVENOUS

## 2015-09-07 MED ORDER — LABETALOL HCL 5 MG/ML IV SOLN
INTRAVENOUS | Status: AC
  Filled 2015-09-07: qty 4

## 2015-09-07 MED ORDER — ACETAMINOPHEN 10 MG/ML IV SOLN
1000.0000 mg | Freq: Once | INTRAVENOUS | Status: AC
  Administered 2015-09-07: 1000 mg via INTRAVENOUS

## 2015-09-07 MED ORDER — HYDROMORPHONE HCL 1 MG/ML IJ SOLN
0.2500 mg | INTRAMUSCULAR | Status: DC | PRN
  Administered 2015-09-07 (×2): 0.5 mg via INTRAVENOUS

## 2015-09-07 MED ORDER — INSULIN ASPART 100 UNIT/ML ~~LOC~~ SOLN
0.0000 [IU] | Freq: Three times a day (TID) | SUBCUTANEOUS | Status: DC
  Administered 2015-09-07 – 2015-09-08 (×2): 3 [IU] via SUBCUTANEOUS
  Administered 2015-09-09: 2 [IU] via SUBCUTANEOUS
  Administered 2015-09-09: 3 [IU] via SUBCUTANEOUS
  Administered 2015-09-09 – 2015-09-10 (×2): 2 [IU] via SUBCUTANEOUS
  Administered 2015-09-10: 3 [IU] via SUBCUTANEOUS
  Administered 2015-09-10 – 2015-09-12 (×2): 2 [IU] via SUBCUTANEOUS

## 2015-09-07 MED ORDER — SODIUM CHLORIDE 0.9 % IJ SOLN
INTRAMUSCULAR | Status: AC
  Filled 2015-09-07: qty 20

## 2015-09-07 MED ORDER — SODIUM CHLORIDE 0.45 % IV SOLN
INTRAVENOUS | Status: DC
  Administered 2015-09-07: 18:00:00 via INTRAVENOUS

## 2015-09-07 MED ORDER — PROPOFOL 10 MG/ML IV BOLUS
INTRAVENOUS | Status: AC
  Filled 2015-09-07: qty 20

## 2015-09-07 MED ORDER — SUGAMMADEX SODIUM 500 MG/5ML IV SOLN
INTRAVENOUS | Status: DC | PRN
Start: 2015-09-07 — End: 2015-09-07
  Administered 2015-09-07: 500 mg via INTRAVENOUS

## 2015-09-07 MED ORDER — BUPIVACAINE HCL (PF) 0.25 % IJ SOLN
INTRAMUSCULAR | Status: DC | PRN
  Administered 2015-09-07: 10 mL

## 2015-09-07 MED ORDER — STERILE WATER FOR IRRIGATION IR SOLN
Status: DC | PRN
  Administered 2015-09-07: 1

## 2015-09-07 MED ORDER — HYDROCODONE-ACETAMINOPHEN 5-325 MG PO TABS: 1.0000 | ORAL_TABLET | Freq: Four times a day (QID) | ORAL | Status: DC | PRN

## 2015-09-07 MED ORDER — FUROSEMIDE 10 MG/ML IJ SOLN
40.0000 mg | Freq: Two times a day (BID) | INTRAMUSCULAR | Status: DC
  Administered 2015-09-07: 40 mg via INTRAVENOUS
  Filled 2015-09-07: qty 4

## 2015-09-07 MED ORDER — METHOCARBAMOL 1000 MG/10ML IJ SOLN
500.0000 mg | Freq: Once | INTRAVENOUS | Status: AC
  Administered 2015-09-07: 500 mg via INTRAVENOUS
  Filled 2015-09-07: qty 5

## 2015-09-07 MED ORDER — LACTATED RINGERS IV SOLN
INTRAVENOUS | Status: DC
  Administered 2015-09-07: 1000 mL via INTRAVENOUS

## 2015-09-07 MED ORDER — NEOSTIGMINE METHYLSULFATE 10 MG/10ML IV SOLN
INTRAVENOUS | Status: AC
  Filled 2015-09-07: qty 1

## 2015-09-07 MED ORDER — IRBESARTAN 150 MG PO TABS
150.0000 mg | ORAL_TABLET | Freq: Every day | ORAL | Status: DC
  Administered 2015-09-07: 150 mg via ORAL
  Filled 2015-09-07: qty 1

## 2015-09-07 MED ORDER — FENOFIBRATE 160 MG PO TABS
160.0000 mg | ORAL_TABLET | Freq: Every day | ORAL | Status: DC
  Administered 2015-09-07: 160 mg via ORAL
  Filled 2015-09-07: qty 1

## 2015-09-07 MED ORDER — BUPIVACAINE LIPOSOME 1.3 % IJ SUSP
20.0000 mL | Freq: Once | INTRAMUSCULAR | Status: AC
  Administered 2015-09-07: 20 mL
  Filled 2015-09-07: qty 20

## 2015-09-07 MED ORDER — HYDROMORPHONE HCL 1 MG/ML IJ SOLN
INTRAMUSCULAR | Status: DC | PRN
  Administered 2015-09-07 (×2): 1 mg via INTRAVENOUS

## 2015-09-07 MED ORDER — LINAGLIPTIN 5 MG PO TABS
5.0000 mg | ORAL_TABLET | Freq: Every day | ORAL | Status: DC
  Administered 2015-09-07 – 2015-09-13 (×7): 5 mg via ORAL
  Filled 2015-09-07 (×7): qty 1

## 2015-09-07 MED ORDER — ACETAMINOPHEN 10 MG/ML IV SOLN
INTRAVENOUS | Status: AC
  Administered 2015-09-07: 1000 mg
  Filled 2015-09-07: qty 100

## 2015-09-07 MED ORDER — PROPOFOL 10 MG/ML IV BOLUS
INTRAVENOUS | Status: DC | PRN
  Administered 2015-09-07: 200 mg via INTRAVENOUS

## 2015-09-07 MED ORDER — FENTANYL CITRATE (PF) 100 MCG/2ML IJ SOLN
INTRAMUSCULAR | Status: DC | PRN
  Administered 2015-09-07 (×3): 50 ug via INTRAVENOUS
  Administered 2015-09-07: 100 ug via INTRAVENOUS
  Administered 2015-09-07 (×4): 50 ug via INTRAVENOUS

## 2015-09-07 MED ORDER — HYDROMORPHONE HCL 1 MG/ML IJ SOLN
0.2500 mg | INTRAMUSCULAR | Status: DC | PRN
  Administered 2015-09-07 (×4): 0.5 mg via INTRAVENOUS

## 2015-09-07 MED ORDER — LACTATED RINGERS IR SOLN
Status: DC | PRN
  Administered 2015-09-07: 1

## 2015-09-07 MED ORDER — ONDANSETRON HCL 4 MG/2ML IJ SOLN
INTRAMUSCULAR | Status: DC | PRN
  Administered 2015-09-07: 4 mg via INTRAVENOUS

## 2015-09-07 MED ORDER — AMLODIPINE BESYLATE 5 MG PO TABS
5.0000 mg | ORAL_TABLET | Freq: Every day | ORAL | Status: DC
  Administered 2015-09-08 – 2015-09-13 (×6): 5 mg via ORAL
  Filled 2015-09-07 (×6): qty 1

## 2015-09-07 MED ORDER — MIDAZOLAM HCL 2 MG/2ML IJ SOLN
INTRAMUSCULAR | Status: AC
  Filled 2015-09-07: qty 4

## 2015-09-07 MED ORDER — PROMETHAZINE HCL 25 MG/ML IJ SOLN: 6.2500 mg | INTRAMUSCULAR | Status: DC | PRN

## 2015-09-07 MED ORDER — HYDROMORPHONE HCL 1 MG/ML IJ SOLN
0.5000 mg | INTRAMUSCULAR | Status: DC | PRN
  Administered 2015-09-07 – 2015-09-11 (×17): 1 mg via INTRAVENOUS
  Filled 2015-09-07 (×19): qty 1

## 2015-09-07 MED ORDER — MANNITOL 25 % IV SOLN
25.0000 g | Freq: Once | INTRAVENOUS | Status: AC
  Administered 2015-09-07 (×2): 50 mL via INTRAVENOUS
  Filled 2015-09-07: qty 100

## 2015-09-07 MED ORDER — ROCURONIUM BROMIDE 100 MG/10ML IV SOLN
INTRAVENOUS | Status: AC
  Filled 2015-09-07: qty 1

## 2015-09-07 MED ORDER — EPHEDRINE SULFATE 50 MG/ML IJ SOLN
INTRAMUSCULAR | Status: DC | PRN
  Administered 2015-09-07: 10 mg via INTRAVENOUS
  Administered 2015-09-07: 5 mg via INTRAVENOUS

## 2015-09-07 MED ORDER — LACTATED RINGERS IV SOLN
INTRAVENOUS | Status: DC | PRN
  Administered 2015-09-07 (×4): via INTRAVENOUS

## 2015-09-07 SURGICAL SUPPLY — 53 items
APPLICATOR SURGIFLO ENDO (HEMOSTASIS) IMPLANT
CHLORAPREP W/TINT 26ML (MISCELLANEOUS) ×3 IMPLANT
CLIP LIGATING HEM O LOK PURPLE (MISCELLANEOUS) ×3 IMPLANT
CLIP LIGATING HEMO LOK XL GOLD (MISCELLANEOUS) IMPLANT
CLIP LIGATING HEMO O LOK GREEN (MISCELLANEOUS) ×15 IMPLANT
COVER SURGICAL LIGHT HANDLE (MISCELLANEOUS) IMPLANT
COVER TIP SHEARS 8 DVNC (MISCELLANEOUS) ×1 IMPLANT
COVER TIP SHEARS 8MM DA VINCI (MISCELLANEOUS) ×2
DECANTER SPIKE VIAL GLASS SM (MISCELLANEOUS) ×6 IMPLANT
DRAIN CHANNEL 15F RND FF 3/16 (WOUND CARE) ×3 IMPLANT
DRAPE INCISE IOBAN 66X45 STRL (DRAPES) ×3 IMPLANT
DRAPE LAPAROSCOPIC ABDOMINAL (DRAPES) ×3 IMPLANT
DRAPE SHEET LG 3/4 BI-LAMINATE (DRAPES) ×3 IMPLANT
DRSG TEGADERM 4X4.75 (GAUZE/BANDAGES/DRESSINGS) ×6 IMPLANT
ELECT PENCIL ROCKER SW 15FT (MISCELLANEOUS) ×3 IMPLANT
ELECT REM PT RETURN 9FT ADLT (ELECTROSURGICAL) ×3
ELECTRODE REM PT RTRN 9FT ADLT (ELECTROSURGICAL) ×1 IMPLANT
EVACUATOR SILICONE 100CC (DRAIN) ×3 IMPLANT
GLOVE BIO SURGEON STRL SZ 6.5 (GLOVE) ×2 IMPLANT
GLOVE BIO SURGEONS STRL SZ 6.5 (GLOVE) ×1
GLOVE BIOGEL M STRL SZ7.5 (GLOVE) ×6 IMPLANT
GOWN STRL REUS W/TWL LRG LVL3 (GOWN DISPOSABLE) ×9 IMPLANT
GOWN STRL REUS W/TWL XL LVL3 (GOWN DISPOSABLE) ×3 IMPLANT
KIT ACCESSORY DA VINCI DISP (KITS) ×2
KIT ACCESSORY DVNC DISP (KITS) ×1 IMPLANT
KIT BASIN OR (CUSTOM PROCEDURE TRAY) ×3 IMPLANT
LIQUID BAND (GAUZE/BANDAGES/DRESSINGS) ×3 IMPLANT
LOOP VESSEL MAXI BLUE (MISCELLANEOUS) ×3 IMPLANT
NEEDLE INSUFFLATION 14GA 120MM (NEEDLE) ×3 IMPLANT
NS IRRIG 1000ML POUR BTL (IV SOLUTION) ×3 IMPLANT
POSITIONER SURGICAL ARM (MISCELLANEOUS) ×6 IMPLANT
POUCH SPECIMEN RETRIEVAL 10MM (ENDOMECHANICALS) ×3 IMPLANT
SET TUBE IRRIG SUCTION NO TIP (IRRIGATION / IRRIGATOR) IMPLANT
SOLUTION ELECTROLUBE (MISCELLANEOUS) ×3 IMPLANT
SPONGE LAP 4X18 X RAY DECT (DISPOSABLE) ×3 IMPLANT
SURGIFLO W/THROMBIN 8M KIT (HEMOSTASIS) ×3 IMPLANT
SUT ETHILON 3 0 PS 1 (SUTURE) ×3 IMPLANT
SUT MNCRL AB 4-0 PS2 18 (SUTURE) ×6 IMPLANT
SUT PDS AB 1 CT1 27 (SUTURE) ×3 IMPLANT
SUT V-LOC BARB 180 2/0GR6 GS22 (SUTURE) ×3
SUT V-LOC BARB 180 2/0GR9 GS23 (SUTURE)
SUT VICRYL 0 UR6 27IN ABS (SUTURE) ×3 IMPLANT
SUT VLOC BARB 180 ABS3/0GR12 (SUTURE) ×3
SUTURE V-LC BRB 180 2/0GR6GS22 (SUTURE) ×1 IMPLANT
SUTURE V-LC BRB 180 2/0GR9GS23 (SUTURE) IMPLANT
SUTURE VLOC BRB 180 ABS3/0GR12 (SUTURE) ×1 IMPLANT
TOWEL OR 17X26 10 PK STRL BLUE (TOWEL DISPOSABLE) ×6 IMPLANT
TOWEL OR NON WOVEN STRL DISP B (DISPOSABLE) ×3 IMPLANT
TRAY FOLEY W/METER SILVER 16FR (SET/KITS/TRAYS/PACK) ×3 IMPLANT
TRAY LAPAROSCOPIC (CUSTOM PROCEDURE TRAY) ×3 IMPLANT
TROCAR UNIVERSAL OPT 12M 100M (ENDOMECHANICALS) ×3 IMPLANT
TROCAR XCEL 12X100 BLDLESS (ENDOMECHANICALS) ×3 IMPLANT
WATER STERILE IRR 1500ML POUR (IV SOLUTION) ×3 IMPLANT

## 2015-09-07 NOTE — Op Note (Signed)
Preoperative diagnosis:  1. Right renal mass   Postoperative diagnosis:  1. same   Procedure: 1. Robotic assisted laparoscopic partial nephrectomy  Surgeon: Ardis Hughs, MD Resident assistant: Harvel Ricks, MD 1st assistant: Debbrah Alar, PA-C  Anesthesia: General  Complications: None  Intraoperative findings: Large upper for renal mass requiring the whole upper pole of the right kidney be removed.  Warm ischemic time was exactly 30 minutes.  EBL: 750cc  Specimens: Right renal mass  Indication: Lawrence [name] is a 59 y.o. patient with bilateral renal masses. The right renal mass is larger and is such I counseled the patient on removing the right one initially and then following up in a staged manner for the left partial nephrectomy.  After reviewing the management options for treatment, he elected to proceed with the above surgical procedure(s). We have discussed the potential benefits and risks of the procedure, side effects of the proposed treatment, the likelihood of the patient achieving the goals of the procedure, and any potential problems that might occur during the procedure or recuperation. Informed consent has been obtained.  Description of procedure:  The patient was taken to the operating room and general anesthesia was induced.  The patient was placed in the dorsal lithotomy position, prepped and draped in the usual sterile fashion, and preoperative antibiotics were administered. A preoperative time-out was performed.   Description of procedure:  The patient was taken to the operating room and a general anesthetic was administered. The patient was given preoperative antibiotics, placed in the right modified flank position with care to pad all potential pressure points, and prepped and draped in the usual sterile fashion. Next a preoperative timeout was performed.  A site was selected on the right side of the umbilicus for placement of the camera port.  A small  1 cm incision was made and dissected down to the anterior rectus fascia. I then incised the fascia and spread the muscle with a Claiborne Billings. I next the peritoneum with a knife and inserted a 8 mm trocar. We entered the peritoneum without incident and established pneumoperitoneum. The camera was then used to inspect the abdomen and there was no evidence of any intra-abdominal injuries or other abnormalities. The remaining abdominal ports were then placed. 8 mm robotic ports were placed in the right upper quadrant, right lower quadrant, and far right lateral abdominal wall. A 12 mm port was placed in the upper midline for laparoscopic assistance. Lysis of adhesions were performed at the midline to allow placement of the 12 mm assistant port. All ports were placed under direct vision without difficulty. The surgical cart was then docked.   Utilizing the cautery scissors, the white line of Toldt was incised allowing the colon to be mobilized medially and the plane between the mesocolon and the anterior layer of Gerota's fascia to be developed and the kidney to be exposed. The ureter and gonadal vein were identified inferiorly and the ureter was lifted anteriorly off the psoas muscle. Dissection proceeded superiorly along the gonadal vein until the renal vein was identified. The renal hilum was then carefully isolated with a combination of blunt and sharp dissection allowing the renal arterial and venous structures to be separated and isolated in preparation for renal hilar vessel clamping.  Attention turned to the kidney and the perinephric fat surrounding the renal mass was removed and the kidney was mobilized sufficiently for exposure and resection of the renal mass. 12.5 g of IV mannitol was then administered.   Once the  renal mass was properly isolated, preparations were made for resection of the tumor. The renal artery was then clamped with a bulldog clamp. The tumor was then excised with cold scissor  dissection along with an adequate visible gross margin of normal renal parenchyma. The tumor appeared to be excised without any gross violation of the tumor. The renal collecting system was not entered during removal of the tumor. A running 3-0 V-lock suture was then brought through the capsule of the kidney and run along the base of the renal defect to provide hemostasis and close any entry into the renal collecting system if present. Weck clips were used to secure this suture outside the renal capsule at the proximal and distal ends. The bulldog clamps were then removed from the renal hilar vessel and an additional 12.5 g of IV mannitol was administered. A running 2-0 V lock suture was then used to close the capsule of the kidney using a sliding clip technique which resulted in excellent hemostasis. An additional hemostatic agent (Surgiflo) was then placed into the renal defect.   Total warm renal ischemia time was 30 minutes. The renal tumor resection site was examined. Hemostasis appeared adequate.   The kidney was placed back into its normal anatomic position and covered with perinephric fat as needed. A # 54 Blake drain was then brought through the lateral lower port site and positioned in the perinephric space. It was secured to the skin with a nylon suture. The surgical robotic cart was undocked. The renal tumor specimen was removed intact within an endopouch retrieval bag via the camera port sites. The camera port site and the other 12 mm port site were then closed at the fascial level with 0-vicryl suture. All other laparoscopic/robotic ports were removed under direct vision and the pneumoperitoneum let down with inspection of the operative field performed and hemostasis again confirmed. All incision sites were then injected with local anesthetic and reapproximated at the skin level with 4-0 monocryl subcuticular closures. Dermabond was applied to the skin. The patient tolerated the procedure  well and without complications. The patient was able to be extubated and transferred to the recovery unit in satisfactory condition.  Ardis Hughs, M.D. Ardis Hughs, M.D.

## 2015-09-07 NOTE — Progress Notes (Addendum)
   Pt with rhabdomyolysis. Discussed with Dr. Louis Meckel. CK >50000. Discussed with Dr. Joelyn Oms. K and Ca OK for now. I d/c'd fenofibrate, lasix and Avapro. Monitor UOP and fluid status. Monitor for compartment syndrome. New BMP sent now.   Add: Discussed with nurse, also discussed neuro checks. UOP good  - another 500 ml; lytes stable.

## 2015-09-07 NOTE — Anesthesia Postprocedure Evaluation (Signed)
  Anesthesia Post-op Note  Patient: Philmore Lepore  Procedure(s) Performed: Procedure(s): RIGHT ROBOTIC ASSITED PARTIAL NEPHRECTOMY (Right)  Patient Location: PACU  Anesthesia Type:General  Level of Consciousness: awake  Airway and Oxygen Therapy: Patient Spontanous Breathing  Post-op Pain: mild  Post-op Assessment: Post-op Vital signs reviewed              Post-op Vital Signs: Reviewed  Last Vitals:  Filed Vitals:   09/07/15 1415  BP: 177/80  Pulse:   Temp:   Resp:     Complications: No apparent anesthesia complications

## 2015-09-07 NOTE — Progress Notes (Signed)
PACU note---pt c/o pain bilateral hips right greater than left; repositioned, pain meds given; pt states feels like arthritic pain, Dr. Oletta Lamas and Dr. Louis Meckel notified with orders rec'd and meds given and Dr. Louis Meckel in to see pt; will check on pt later; continue to observe and treat pain

## 2015-09-07 NOTE — H&P (Signed)
Reason For Visit Bilateral renal masses   History of Present Illness 59 year old male who presents today as a referral from Dr. Karsten Ro, MD for further discussion on treatment of the bilateral renal masses. The larger one on the right was initially seen on ultrasound in 2014. This was followed by an MRI confirming the enhancing aspect of the lesion. The patient opted for surveillance and ultimately was seen again in 1 month ago with a CT scan prior. Also noted was a smaller lesion in the left lower pole also concerning for malignancy. The patient denies any flank pain, hematuria, or recent weight loss. He denies any constitutional symptoms. The patient has no history of cancer although he does have a family history of prostate cancer and colon cancer from his dad. The patient is severely overweight but is otherwise in reasonably good shape. He has no trouble walking flights of steps or taking care of his activities of daily living. He is non-insulin-dependent diabetic, his last A1c was 5.8. He also has a history of iron deficiency anemia, hypertension, and hyperlipidemia. The patient states he has had 2 colonoscopies for his anemia, the etiology remains unknown. The patient has no past surgical history.   Past Medical History Problems  1. History of diabetes mellitus (Z86.39) 2. History of hypercholesterolemia (Z86.39) 3. History of hypertension (Z86.79) 4. History of osteoarthritis (Z87.39) 5. Rule out Renal neoplasm (W97.989)  Surgical History Problems  1. History of Varicose Vein Ligation  Current Meds 1. AmLODIPine Besylate 5 MG Oral Tablet;  Therapy: [id number] to Recorded 2. Fenofibrate 160 MG Oral Tablet;  Therapy: [id number] to Recorded 3. Januvia 100 MG Oral Tablet;  Therapy: (Recorded:23Aug2016) to Recorded 4. Metoprolol Succinate ER TB24;  Therapy: (Recorded:29Oct2014) to Recorded 5. Prenatal-U 106.5-1 MG Oral Capsule;  Therapy: 31SHF0263 to Recorded  Allergies Medication  1.  No Known Drug Allergies  Family History Problems  1. Family history of malignant neoplasm of prostate (Z80.42) 2. No pertinent family history : Mother 3. Family history of Prostate Cancer  Social History Problems  1. Denied: History of Alcohol Use 2. Caffeine Use 3. Former smoker [phone])   1.5ppdx20 years ago quit in 2005 4. Marital History - Currently Married  Review of Systems No chest pain, no shortness of breath, no recent weight loss or night sweats, normal energy level.   Vitals Vital Signs [Data Includes: Last 1 Day]  Recorded: 26Sep2016 03:48PM  Weight: 330 lb  BMI Calculated: 44.76 BSA Calculated: 2.64 Blood Pressure: 145 / 76 Heart Rate: 55  Physical Exam Constitutional: Well nourished and well developed . No acute distress.  ENT:. The ears and nose are normal in appearance.  Neck: The appearance of the neck is normal and no neck mass is present.  Pulmonary: No respiratory distress and normal respiratory rhythm and effort.  Cardiovascular: Heart rate and rhythm are normal . No peripheral edema. No obvious murmurs are appreciated.  Abdomen: The abdomen is obese. The abdomen is soft and nontender. No masses are palpated. No CVA tenderness. No hernias are palpable. No hepatosplenomegaly noted.  Rectal: The prostate exam was deferred.  Lymphatics: The femoral and inguinal nodes are not enlarged or tender.  Skin: Normal skin turgor, no visible rash and no visible skin lesions.  Neuro/Psych:. Mood and affect are appropriate.    Results/Data Urine [Data Includes: Last 1 Day]   26Sep2016  COLOR YELLOW   APPEARANCE CLEAR   SPECIFIC GRAVITY 1.025   pH 7.0   GLUCOSE NEGATIVE   BILIRUBIN NEGATIVE  KETONE NEGATIVE   BLOOD NEGATIVE   PROTEIN NEGATIVE   NITRITE NEGATIVE   LEUKOCYTE ESTERASE NEGATIVE    I've independently reviewed the patient's CT scan which was performed roughly 1 month ago. The patient has a almost 4 cm renal mass in the right upper pole on the  posterior aspect that those of but the collecting system superiorly. There is 1 renal artery and one renal vein with an accessory renal artery to the lower pole. The renal mass enhances and is cystic appearing. In addition, the patient has a 1.7 cm mildly enhancing mass in the left lower pole.   Assessment Assessed  1. 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 UA With REFLEX; [Do Not Release]; Status:Resulted - Requires Verification;   Done:  [id number] 03:37PM Renal neoplasm  2. Follow-up Schedule Surgery Office  Follow-up  Status: Complete  Done: 26Sep2016  Discussion/Summary The patient has been given the natural history of renal cancer, treatment options, and recommended surgical extirpation for this patient. I went over the robotic-assisted laparoscopic partial nephrectomy approach. I described for the patient the procedure in detail including port placement. I detailed the postoperative course including the fact that the patient would have both a drain and a Foley catheter following the surgery. I told the patient that most often patients are discharged on postoperative day one or 2. I then detailed the expected recovery time, I told the patient that he would not be able to lift anything greater than 20 pounds for 4 weeks. I also went over the risks and benefits of this operation in great detail. We discussed the risk of injury to surrounding structures, major blood vessels and nerves, bleeding, infection, loss of kidney, and the risk of recurrent cancer.     Our plan is to tackle the renal mass on the right. We'll then discuss treatment for his renal mass on the left.

## 2015-09-07 NOTE — Anesthesia Postprocedure Evaluation (Signed)
  Anesthesia Post-op Note  Patient: Lawrence [name]  Procedure(s) Performed: Procedure(s): RIGHT ROBOTIC ASSITED PARTIAL NEPHRECTOMY (Right)  Patient Location: PACU  Anesthesia Type:General  Level of Consciousness: awake  Airway and Oxygen Therapy: Patient Spontanous Breathing  Post-op Pain: mild  Post-op Assessment: Post-op Vital signs reviewed              Post-op Vital Signs: Reviewed  Last Vitals:  Filed Vitals:   09/07/15 0537  BP: 140/75  Pulse: 18  Temp: 36.5 C  Resp: 18    Complications: No apparent anesthesia complications

## 2015-09-07 NOTE — Anesthesia Preprocedure Evaluation (Addendum)
Anesthesia Evaluation  Patient identified by MRN, date of birth, ID band  Reviewed: Allergy & Precautions, NPO status , Patient's Chart, lab work & pertinent test results  Airway Mallampati: II  TM Distance: >3 FB Neck ROM: Full    Dental   Pulmonary former smoker,    breath sounds clear to auscultation       Cardiovascular hypertension, + Peripheral Vascular Disease   Rhythm:Regular Rate:Normal     Neuro/Psych    GI/Hepatic negative GI ROS,   Endo/Other  diabetes  Renal/GU Renal disease     Musculoskeletal   Abdominal   Peds  Hematology   Anesthesia Other Findings   Reproductive/Obstetrics                            Anesthesia Physical Anesthesia Plan  ASA: III  Anesthesia Plan: General   Post-op Pain Management:    Induction: Intravenous  Airway Management Planned: Oral ETT  Additional Equipment:   Intra-op Plan:   Post-operative Plan: Extubation in OR  Informed Consent: I have reviewed the patients History and Physical, chart, labs and discussed the procedure including the risks, benefits and alternatives for the proposed anesthesia with the patient or authorized representative who has indicated his/her understanding and acceptance.   Dental advisory given  Plan Discussed with: CRNA and Anesthesiologist  Anesthesia Plan Comments:         Anesthesia Quick Evaluation

## 2015-09-07 NOTE — Anesthesia Procedure Notes (Signed)
Procedure Name: Intubation Date/Time: 09/07/2015 7:35 AM Performed by: Montel Clock Pre-anesthesia Checklist: Patient identified, Emergency Drugs available, Suction available, Patient being monitored and Timeout performed Patient Re-evaluated:Patient Re-evaluated prior to inductionOxygen Delivery Method: Circle system utilized Preoxygenation: Pre-oxygenation with 100% oxygen Intubation Type: IV induction Ventilation: Mask ventilation without difficulty and Oral airway inserted - appropriate to patient size Laryngoscope Size: Mac and 3 Grade View: Grade II Tube type: Oral Tube size: 7.5 mm Number of attempts: 1 Airway Equipment and Method: Stylet Placement Confirmation: ETT inserted through vocal cords under direct vision,  positive ETCO2 and breath sounds checked- equal and bilateral Secured at: 23 cm Tube secured with: Tape Dental Injury: Teeth and Oropharynx as per pre-operative assessment

## 2015-09-07 NOTE — Transfer of Care (Signed)
Immediate Anesthesia Transfer of Care Note  Patient: Lawrence [name]  Procedure(s) Performed: Procedure(s): RIGHT ROBOTIC ASSITED PARTIAL NEPHRECTOMY (Right)  Patient Location: PACU  Anesthesia Type:General  Level of Consciousness:  sedated, patient cooperative and responds to stimulation  Airway & Oxygen Therapy:Patient Spontanous Breathing and Patient connected to face mask oxgen  Post-op Assessment:  Report given to PACU RN and Post -op Vital signs reviewed and stable  Post vital signs:  Reviewed and stable  Last Vitals:  Filed Vitals:   09/07/15 0537  BP: 140/75  Pulse: 18  Temp: 36.5 C  Resp: 18    Complications: No apparent anesthesia complications

## 2015-09-07 NOTE — Discharge Instructions (Signed)

## 2015-09-08 LAB — RENAL FUNCTION PANEL
ALBUMIN: 3.5 g/dL (ref 3.5–5.0)
Anion gap: 8 (ref 5–15)
BUN: 25 mg/dL — ABNORMAL HIGH (ref 6–20)
CO2: 33 mmol/L — AB (ref 22–32)
Calcium: 7.6 mg/dL — ABNORMAL LOW (ref 8.9–10.3)
Chloride: 89 mmol/L — ABNORMAL LOW (ref 101–111)
Creatinine, Ser: 2.66 mg/dL — ABNORMAL HIGH (ref 0.61–1.24)
GFR, EST AFRICAN AMERICAN: 29 mL/min — AB (ref >60)
GFR, EST NON AFRICAN AMERICAN: 25 mL/min — AB (ref >60)
GLUCOSE: 134 mg/dL — AB (ref 65–99)
Phosphorus: 3.9 mg/dL (ref 2.5–4.6)
Potassium: 4 mmol/L (ref 3.5–5.1)
Sodium: 130 mmol/L — ABNORMAL LOW (ref 135–145)

## 2015-09-08 LAB — GLUCOSE, CAPILLARY
GLUCOSE-CAPILLARY: 148 mg/dL — AB (ref 65–99)
GLUCOSE-CAPILLARY: 166 mg/dL — AB (ref 65–99)
Glucose-Capillary: 119 mg/dL — ABNORMAL HIGH (ref 65–99)
Glucose-Capillary: 158 mg/dL — ABNORMAL HIGH (ref 65–99)

## 2015-09-08 LAB — BASIC METABOLIC PANEL
ANION GAP: 10 (ref 5–15)
Anion gap: 8 (ref 5–15)
BUN: 23 mg/dL — ABNORMAL HIGH (ref 6–20)
BUN: 25 mg/dL — AB (ref 6–20)
CO2: 29 mmol/L (ref 22–32)
CO2: 31 mmol/L (ref 22–32)
CREATININE: 2.5 mg/dL — AB (ref 0.61–1.24)
Calcium: 7.7 mg/dL — ABNORMAL LOW (ref 8.9–10.3)
Calcium: 7.9 mg/dL — ABNORMAL LOW (ref 8.9–10.3)
Chloride: 96 mmol/L — ABNORMAL LOW (ref 101–111)
Chloride: 98 mmol/L — ABNORMAL LOW (ref 101–111)
Creatinine, Ser: 2.57 mg/dL — ABNORMAL HIGH (ref 0.61–1.24)
GFR calc non Af Amer: 27 mL/min — ABNORMAL LOW (ref >60)
GFR, EST AFRICAN AMERICAN: 30 mL/min — AB (ref >60)
GFR, EST AFRICAN AMERICAN: 31 mL/min — AB (ref >60)
GFR, EST NON AFRICAN AMERICAN: 26 mL/min — AB (ref >60)
GLUCOSE: 181 mg/dL — AB (ref 65–99)
Glucose, Bld: 151 mg/dL — ABNORMAL HIGH (ref 65–99)
POTASSIUM: 4.9 mmol/L (ref 3.5–5.1)
Potassium: 4.1 mmol/L (ref 3.5–5.1)
Sodium: 135 mmol/L (ref 135–145)
Sodium: 137 mmol/L (ref 135–145)

## 2015-09-08 LAB — HEMOGLOBIN AND HEMATOCRIT, BLOOD
HEMATOCRIT: 44.2 % (ref 39.0–52.0)
Hemoglobin: 14.8 g/dL (ref 13.0–17.0)

## 2015-09-08 MED ORDER — OXYCODONE HCL 5 MG PO TABS
5.0000 mg | ORAL_TABLET | ORAL | Status: DC | PRN
  Administered 2015-09-08: 15 mg via ORAL
  Administered 2015-09-08 – 2015-09-13 (×12): 10 mg via ORAL
  Administered 2015-09-13: 5 mg via ORAL
  Filled 2015-09-08: qty 1
  Filled 2015-09-08 (×2): qty 2
  Filled 2015-09-08: qty 3
  Filled 2015-09-08 (×7): qty 2
  Filled 2015-09-08: qty 1
  Filled 2015-09-08: qty 3
  Filled 2015-09-08 (×2): qty 2

## 2015-09-08 NOTE — Progress Notes (Signed)
CPAP is setup and ready for use.  Patient to self-administer with the help of his wife.  They are familiar with the equipment and procedure.

## 2015-09-08 NOTE — Consult Note (Signed)
Lawrence [name] Admit Date: 09/07/2015 09/08/2015 Lawrence [name] Requesting Physician:  Lawrence [name]  Reason for Consult:  AKI, Hyperkalemia, Rhabdomyolysis HPI:  [name] s/p robotic partial nephrectomy 09/07/15 for a renal mass.  Other PMH includes HTN, DM2, HLD, OA.  He has prolonged postioning in the OR.  Also on ARB and fibrate.  Baseline SCr suggests some mild CKD.  Post surgery was noted to have SCr increased, mild hyperkalemia, and decreased calcium.  CK level was checked, > 50,000.  He is on D5W + 16mq NaHCO3 at 2057mhr.  ARB and fibrate stopped.  He describes a 'charlie horse' in L buttock, very painful.     CREATININE, SER (mg/dL)  Date Value  09/08/2015 2.50*  09/08/2015 2.57*  09/07/2015 2.19*  08/31/2015 1.29*  05/21/2015 1.31*  01/11/2014 1.32  12/30/2013 1.40*  ] I/Os: I/O last 3 completed shifts: In: 4867.1 [P.O.:240; I.V.:4627.1] Out: 185102Urine:725; Drains:350; Blood:750]   ROS Balance of 12 systems is negative w/ exceptions as above  PMH  Past Medical History  Diagnosis Date  . Hypertension   . Diabetes mellitus without complication ([facility]  . Chronic kidney disease     bil renal mass  . Arthritis     osteoarthritis   PSH  Past Surgical History  Procedure Laterality Date  . Left gsv laser ablation    . Tonsillectomy    . Robotic assited partial nephrectomy Right 09/07/2015    Procedure: RIGHT ROBOTIC ASSITED PARTIAL NEPHRECTOMY;  Surgeon: BeArdis HughsMD;  Location: [facility];  Service: Urology;  Laterality: Right;   FH  Family History  Problem Relation Age of Onset  . Cancer Father   . Hyperlipidemia Brother    SH  reports that he quit smoking about 10 years ago. He does not have any smokeless tobacco history on file. He reports that he drinks alcohol. He reports that he does not use illicit drugs. Allergies No Known Allergies Home medications Prior to Admission medications   Medication Sig Start Date End Date Taking? Authorizing Provider   amLODipine (NORVASC) 5 MG tablet Take 5 mg by mouth daily.   Yes Historical Provider, MD  fenofibrate 160 MG tablet Take 160 mg by mouth daily.  08/10/12  Yes Historical Provider, MD  metoprolol succinate (TOPROL-XL) 50 MG 24 hr tablet Take 50 mg by mouth daily. Take with or immediately following a meal.   Yes Historical Provider, MD  Misc Natural Products (OSTEO BI-FLEX ADV TRIPLE ST PO) Take 2 tablets by mouth daily.   Yes Historical Provider, MD  Omega-3 Fatty Acids (FISH OIL) 1200 MG CAPS Take 1 capsule by mouth daily.   Yes Historical Provider, MD  OVER THE COUNTER MEDICATION Take 2 capsules by mouth daily. Prostate Health Complex   Yes Historical Provider, MD  Prenatal w/o A Vit-Fe Fum-FA (PRENATAL-U) 106.5-1 MG CAPS Take 1 capsule by mouth daily.  08/10/12  Yes Historical Provider, MD  sitaGLIPtin (JANUVIA) 100 MG tablet Take 100 mg by mouth daily.   Yes Historical Provider, MD  testosterone (ANDROGEL) 50 MG/5GM (1%) GEL Place 5 g onto the skin daily. 2 pumps to each shoulder daily (20.2529mer pump=82m57mtal/day)   Yes Historical Provider, MD  vitamin B-12 (CYANOCOBALAMIN) 1000 MCG tablet Take 1,000 mcg by mouth daily.   Yes Historical Provider, MD  vitamin C (ASCORBIC ACID) 500 MG tablet Take 500 mg by mouth daily.   Yes Historical Provider, MD  Blood Glucose Monitoring Suppl (ONE TOUCH ULTRA 2) W/DEVICE KIT  08/26/12  Historical Provider, MD  HYDROcodone-acetaminophen (NORCO) 5-325 MG tablet Take 1-2 tablets by mouth every 6 (six) hours as needed. 09/07/15   Debbrah Alar, PA-C  mupirocin ointment (BACTROBAN) 2 %  08/23/12   Historical Provider, MD  olmesartan (BENICAR) 20 MG tablet Take 20 mg by mouth daily.    Historical Provider, MD    Current Medications Scheduled Meds: . amLODipine  5 mg Oral Daily  . insulin aspart  0-15 Units Subcutaneous TID WC  . linagliptin  5 mg Oral Daily  . metoprolol succinate  50 mg Oral Daily   Continuous Infusions: .  sodium bicarbonate  infusion  1000 mL 200 mL/hr at 09/08/15 0609   PRN Meds:.HYDROmorphone (DILAUDID) injection, ondansetron, oxyCODONE  CBC  Recent Labs Lab 09/07/15 1407 09/08/15 0515  HGB 15.6 14.8  HCT 47.9 49.1   Basic Metabolic Panel  Recent Labs Lab 09/07/15 1950 09/08/15 0010 09/08/15 0515  NA 136 137 135  K 5.8* 4.9 4.1  CL 105 98* 96*  CO2 _0 GLUCOSE 165* 181* 151*  BUN 21* 23* 25*  CREATININE 2.19* 2.57* 2.50*  CALCIUM 8.0* 7.9* 7.7*    Physical Exam  Blood pressure 132/62, pulse 79, temperature 98.8 F (37.1 C), temperature source Oral, resp. rate 18, height 6' (1.829 m), weight 151.048 kg (333 lb), SpO2 95 %. GEN: NAD, obese ENT: NCAT EYES: EOMI CV: RRR, nl s1s2  PULM: CTAB ABD: s/nt/nd SKIN: no rashes/lesions EXT:no LEE MSK: Tender in L buttock not taut   Assessment/Plan [name] with postoperative rhabdomyolysis and AKI in setting of mild CKD.v  Cause of rhabdomyolysis is muscle compression during surgery most likely   1. AKI 2/2 Rhabdomyolysis 1. As long as is making urine, cont D5W+155mq NaHCO3, carefully monitor volume status 2. BID Renal Panel 3. Vigilance for any compartment syndrome Daily weights, Strict I/Os, Avoid nephrotoxins (NSAIDs, judicious IV Contrast) 2. Hyperkalemia: resolved with hydration and base 3. S/p Lawrence partial nephrectomy for renal mass 4. DM2 5. HTN   RPearson GrippeMD [phone] 09/08/2015, 6:31 AM

## 2015-09-08 NOTE — Progress Notes (Signed)
   Left butt sore, but slightly better. Trouble standing (pain and maybe weakness). No issues with leg, feet. Wife looked at his buttocks today and reports no bruising or color changes. No flatus. Pain when laying in bed. Request air mattress.   NAD Sitting in a chair' abd - mild distended, good BS, soft, NT Ext - no calf or foot pain. Feet and legs warm.   Intake/Output Summary (Last 24 hours) at 09/08/15 1626 Last data filed at 09/08/15 1539  Gross per 24 hour  Intake 3108.75 ml  Output   3370 ml  Net -261.25 ml     BMP stable  CK and BMP pending  -POD#1 right partial Nx, rhabdomyolysis - -labs stable, new labs pending, good UOP.  -PT -air mattress -appreciate nephrology input

## 2015-09-08 NOTE — Progress Notes (Signed)
Utilization Review Completed.Lawrence [name] T11/03/2015  

## 2015-09-09 LAB — BASIC METABOLIC PANEL
ANION GAP: 9 (ref 5–15)
ANION GAP: 8 (ref 5–15)
BUN: 28 mg/dL — AB (ref 6–20)
BUN: 28 mg/dL — ABNORMAL HIGH (ref 6–20)
CALCIUM: 7.1 mg/dL — AB (ref 8.9–10.3)
CHLORIDE: 83 mmol/L — AB (ref 101–111)
CO2: 35 mmol/L — ABNORMAL HIGH (ref 22–32)
CO2: 38 mmol/L — AB (ref 22–32)
Calcium: 7 mg/dL — ABNORMAL LOW (ref 8.9–10.3)
Chloride: 81 mmol/L — ABNORMAL LOW (ref 101–111)
Creatinine, Ser: 2.57 mg/dL — ABNORMAL HIGH (ref 0.61–1.24)
Creatinine, Ser: 2.6 mg/dL — ABNORMAL HIGH (ref 0.61–1.24)
GFR calc Af Amer: 30 mL/min — ABNORMAL LOW (ref >60)
GFR calc non Af Amer: 26 mL/min — ABNORMAL LOW (ref >60)
GFR calc non Af Amer: 26 mL/min — ABNORMAL LOW (ref >60)
GFR, EST AFRICAN AMERICAN: 30 mL/min — AB (ref >60)
GLUCOSE: 175 mg/dL — AB (ref 65–99)
GLUCOSE: 140 mg/dL — AB (ref 65–99)
POTASSIUM: 3.8 mmol/L (ref 3.5–5.1)
POTASSIUM: 3.9 mmol/L (ref 3.5–5.1)
Sodium: 127 mmol/L — ABNORMAL LOW (ref 135–145)
Sodium: 127 mmol/L — ABNORMAL LOW (ref 135–145)

## 2015-09-09 LAB — CBC
HEMATOCRIT: 38.3 % — AB (ref 39.0–52.0)
HEMOGLOBIN: 13.1 g/dL (ref 13.0–17.0)
MCH: 28.6 pg (ref 26.0–34.0)
MCHC: 34.2 g/dL (ref 30.0–36.0)
MCV: 83.6 fL (ref 78.0–100.0)
Platelets: 165 10*3/uL (ref 150–400)
RBC: 4.58 MIL/uL (ref 4.22–5.81)
RDW: 14.1 % (ref 11.5–15.5)
WBC: 19.6 10*3/uL — ABNORMAL HIGH (ref 4.0–10.5)

## 2015-09-09 LAB — GLUCOSE, CAPILLARY
Glucose-Capillary: 154 mg/dL — ABNORMAL HIGH (ref 65–99)
Glucose-Capillary: 143 mg/dL — ABNORMAL HIGH (ref 65–99)
Glucose-Capillary: 118 mg/dL — ABNORMAL HIGH (ref 65–99)

## 2015-09-09 LAB — CK: CK TOTAL: 17150 U/L — AB (ref 49–397)

## 2015-09-09 MED ORDER — SIMETHICONE 80 MG PO CHEW
80.0000 mg | CHEWABLE_TABLET | Freq: Once | ORAL | Status: AC
  Administered 2015-09-09: 80 mg via ORAL
  Filled 2015-09-09: qty 1

## 2015-09-09 MED ORDER — DOCUSATE SODIUM 100 MG PO CAPS
100.0000 mg | ORAL_CAPSULE | Freq: Two times a day (BID) | ORAL | Status: DC
  Administered 2015-09-09 – 2015-09-13 (×9): 100 mg via ORAL
  Filled 2015-09-09 (×9): qty 1

## 2015-09-09 MED ORDER — ALUM & MAG HYDROXIDE-SIMETH 200-200-20 MG/5ML PO SUSP
15.0000 mL | Freq: Four times a day (QID) | ORAL | Status: DC | PRN
  Administered 2015-09-09: 15 mL via ORAL
  Filled 2015-09-09: qty 30

## 2015-09-09 MED ORDER — FUROSEMIDE 10 MG/ML IJ SOLN
40.0000 mg | Freq: Once | INTRAMUSCULAR | Status: AC
  Administered 2015-09-09: 40 mg via INTRAVENOUS
  Filled 2015-09-09: qty 4

## 2015-09-09 MED ORDER — BISACODYL 10 MG RE SUPP
10.0000 mg | Freq: Once | RECTAL | Status: AC
  Administered 2015-09-09: 10 mg via RECTAL
  Filled 2015-09-09: qty 1

## 2015-09-09 NOTE — Progress Notes (Signed)
Patient places himself on and off the CPAP unit. No assistance needed but will call if any changes

## 2015-09-09 NOTE — Progress Notes (Signed)
Received report from [place]

## 2015-09-09 NOTE — Progress Notes (Signed)
12.03 am Contact made with Dr. Junious Silk to report patient was c/o gas discomfort, and increased  tightness and  pain in right leg and hip and patient stated " pain medication is not working." Dr. Junious Silk  Declined to order increased dose of pain medication d/t pt kidney fxn.  TVO with readback order for Simethecone.    0100am   Patient is found sleeping at this time and states that pain and discomfort has declined significantly.

## 2015-09-09 NOTE — Progress Notes (Signed)
  [facility] Progress Note   Subjective: severe pain L thigh is just a little better, no other complaints.  CPK down ~3500, UOP good 2L yesterday, no SOB or cough.  Filed Vitals:   09/08/15 1028 09/08/15 1527 09/08/15 2107 09/09/15 0602  BP: 160/70 127/66 139/70 131/70  Pulse: 83 71 86 82  Temp: 98.2 F (36.8 C) 98.3 F (36.8 C) 99.4 F (37.4 C) 98.9 F (37.2 C)  TempSrc: Axillary Oral Oral Oral  Resp: 20 18 18 18   Height:      Weight:      SpO2: 92% 94% 92% 91%   Exam: Alert, no distress No jvd Chest clear bilat RRR no MRG ABd obese soft +bs L thigh 1-2+ swelling, excellent pedal pulses no cyanosis LLE RLE looks normal as well  Creat 2.66 CPK 3870 Baseline creat 1.2- 1.4 Admit creat 2.19      Assessment: 1 AKI - secondary to rhabdo and some renal tissue loss 2 Acute rhabdomyolysis - numbers down dramatically today, still lots of L leg pain, no signs compartment syndrome 3 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 amLODipine  5 mg Oral Daily  . insulin aspart  0-15 Units Subcutaneous TID WC  . linagliptin  5 mg Oral Daily  . metoprolol succinate  50 mg Oral Daily   .  sodium bicarbonate  infusion 1000 mL 200 mL/hr at 09/09/15 0327   alum & mag hydroxide-simeth, HYDROmorphone (DILAUDID) injection, ondansetron, oxyCODONE '

## 2015-09-09 NOTE — Progress Notes (Signed)
  A lot of gas pain and belching.  Some flatus. Standing with a walker. PT here to work with him.  Filed Vitals:   09/09/15 0918  BP:   Pulse: 62  Temp:   Resp:     Intake/Output Summary (Last 24 hours) at 09/09/15 1331 Last data filed at 09/09/15 0918  Gross per 24 hour  Intake   4040 ml  Output   2715 ml  Net   1325 ml   PE: NAD Sitting in chair He stood for me. Buttocks appear normal, legs and feet normal. Pain seemed better compared to yesterday. Abd - some mild distension. NT. + BS. Incisions c/d/i.  Urine brown.     CBC    Component Value Date/Time   WBC 19.6* 09/09/2015 0859   RBC 4.58 09/09/2015 0859   HGB 13.1 09/09/2015 0859   HCT 38.3* 09/09/2015 0859   PLT 165 09/09/2015 0859   MCV 83.6 09/09/2015 0859   MCH 28.6 09/09/2015 0859   MCHC 34.2 09/09/2015 0859   RDW 14.1 09/09/2015 0859    BMET    Component Value Date/Time   NA 127* 09/09/2015 0859   K 3.8 09/09/2015 0859   CL 83* 09/09/2015 0859   CO2 35* 09/09/2015 0859   GLUCOSE 175* 09/09/2015 0859   BUN 28* 09/09/2015 0859   CREATININE 2.57* 09/09/2015 0859   CALCIUM 7.0* 09/09/2015 0859   GFRNONAA 26* 09/09/2015 0859   GFRAA 30* 09/09/2015 0859   A/P: rhabdo - CK improving. Good UOP. Appreciate Nephrology input. I'm going to back off his fluids just a bit. PT here. Labs at 5 pm and in AM.  Ileus - mild. Gave dulcolax suppository, colace po.   Maybe home tomorrow or Tuesday.

## 2015-09-09 NOTE — Evaluation (Signed)
Physical Therapy Evaluation Patient Details Name: Lawrence [name] MRN: [id number] DOB: [birth year] Today's Date: 09/09/2015   History of Present Illness  59 yo male s/p lap partial nephrectomy 11/4. Post-op rhabdomyolysis hip/glut area L worse than R  Clinical Impression  [name] eval, pt required Min assist for mobility-walked ~50 feet with RW. Limited by pain bil hip/gluteal areas, L worse than R. Pain rated 7-8/10 with activity. Will continue to follow and progress activity as able.     Follow Up Recommendations Home health PT    Equipment Recommendations  Rolling walker with 5" wheels (bari)    Recommendations for Other Services       Precautions / Restrictions Precautions Precautions: Fall Precaution Comments: drain R side abdomen Restrictions Weight Bearing Restrictions: No      Mobility  Bed Mobility               General bed mobility comments: oob in recliner  Transfers Overall transfer level: Needs assistance Equipment used: Rolling walker (2 wheeled) Transfers: Sit to/from Stand Sit to Stand: Min guard         General transfer comment: close guard for safety  Ambulation/Gait Ambulation/Gait assistance: Min assist Ambulation Distance (Feet): 50 Feet Assistive device: Rolling walker (2 wheeled) Gait Pattern/deviations: Step-to pattern;Trunk flexed;Antalgic     General Gait Details: assist to stabilize and maneuver safely with walker. distance limited by pain. VCs safety, proper use of walker  Stairs            Wheelchair Mobility    Modified Rankin (Stroke Patients Only)       Balance Overall balance assessment: Needs assistance         Standing balance support: During functional activity Standing balance-Leahy Scale: Good                               Pertinent Vitals/Pain Pain Assessment: 0-10 Pain Score: 7  Pain Location: bil hip/glut areas, L worse than R Pain Descriptors / Indicators: Sore Pain Intervention(s):  Monitored during session;Repositioned    Home Living Family/patient expects to be discharged to:: Private residence Living Arrangements: Spouse/significant other Available Help at Discharge: Family   Home Access: Stairs to enter   Technical brewer of Steps: 2 (no rail) + 3(rail) Home Layout: Two level;Able to live [name] main level with bedroom/bathroom Home Equipment: None      Prior Function Level of Independence: Independent               Hand Dominance        Extremity/Trunk Assessment   Upper Extremity Assessment: Overall WFL for tasks assessed           Lower Extremity Assessment: Generalized weakness      Cervical / Trunk Assessment: Normal  Communication   Communication: No difficulties  Cognition Arousal/Alertness: Awake/alert Behavior During Therapy: WFL for tasks assessed/performed Overall Cognitive Status: Within Functional Limits for tasks assessed                      General Comments      Exercises        Assessment/Plan    PT Assessment Patient needs continued PT services  PT Diagnosis Difficulty walking;Abnormality of gait;Acute pain   PT Problem List Decreased strength;Decreased range of motion;Decreased activity tolerance;Decreased balance;Decreased mobility;Decreased knowledge of use of DME;Pain  PT Treatment Interventions DME instruction;Gait training;Functional mobility training;Therapeutic activities;Therapeutic exercise;Balance training;Patient/family education   PT Goals (Current  goals can be found in the Care Plan section) Acute Rehab PT Goals Patient Stated Goal: less pain. regain mobility PT Goal Formulation: With patient Time For Goal Achievement: 09/23/15 Potential to Achieve Goals: Good    Frequency Min 3X/week   Barriers to discharge        Co-evaluation               End of Session   Activity Tolerance: Patient limited by fatigue;Patient limited by pain Patient left: in chair;with call  bell/phone within reach;with family/visitor present           Time: 1683-7290 PT Time Calculation (min) (ACUTE ONLY): 25 min   Charges:   PT Evaluation $Initial PT Evaluation Tier I: 1 Procedure     PT G Codes:        Lawrence [name], MPT Pager: [phone]

## 2015-09-10 LAB — BASIC METABOLIC PANEL
ANION GAP: 8 (ref 5–15)
Anion gap: 9 (ref 5–15)
BUN: 31 mg/dL — ABNORMAL HIGH (ref 6–20)
BUN: 32 mg/dL — ABNORMAL HIGH (ref 6–20)
CALCIUM: 7.5 mg/dL — AB (ref 8.9–10.3)
CHLORIDE: 79 mmol/L — AB (ref 101–111)
CO2: 40 mmol/L — AB (ref 22–32)
CO2: 40 mmol/L — AB (ref 22–32)
CREATININE: 2.67 mg/dL — AB (ref 0.61–1.24)
Calcium: 7.2 mg/dL — ABNORMAL LOW (ref 8.9–10.3)
Chloride: 83 mmol/L — ABNORMAL LOW (ref 101–111)
Creatinine, Ser: 2.57 mg/dL — ABNORMAL HIGH (ref 0.61–1.24)
GFR calc non Af Amer: 25 mL/min — ABNORMAL LOW (ref >60)
GFR calc non Af Amer: 26 mL/min — ABNORMAL LOW (ref >60)
GFR, EST AFRICAN AMERICAN: 29 mL/min — AB (ref >60)
GFR, EST AFRICAN AMERICAN: 30 mL/min — AB (ref >60)
GLUCOSE: 152 mg/dL — AB (ref 65–99)
Glucose, Bld: 141 mg/dL — ABNORMAL HIGH (ref 65–99)
Potassium: 3.4 mmol/L — ABNORMAL LOW (ref 3.5–5.1)
Potassium: 3.6 mmol/L (ref 3.5–5.1)
Sodium: 128 mmol/L — ABNORMAL LOW (ref 135–145)
Sodium: 131 mmol/L — ABNORMAL LOW (ref 135–145)

## 2015-09-10 LAB — CREATININE, URINE, RANDOM: Creatinine, Urine: 43.89 mg/dL

## 2015-09-10 LAB — CBC
HEMATOCRIT: 38.3 % — AB (ref 39.0–52.0)
HEMOGLOBIN: 12.9 g/dL — AB (ref 13.0–17.0)
MCH: 28.8 pg (ref 26.0–34.0)
MCHC: 33.7 g/dL (ref 30.0–36.0)
MCV: 85.5 fL (ref 78.0–100.0)
Platelets: 175 10*3/uL (ref 150–400)
RBC: 4.48 MIL/uL (ref 4.22–5.81)
RDW: 14.1 % (ref 11.5–15.5)
WBC: 17.6 10*3/uL — ABNORMAL HIGH (ref 4.0–10.5)

## 2015-09-10 LAB — URINALYSIS, ROUTINE W REFLEX MICROSCOPIC
BILIRUBIN URINE: NEGATIVE
GLUCOSE, UA: NEGATIVE mg/dL
KETONES UR: NEGATIVE mg/dL
Nitrite: NEGATIVE
PROTEIN: NEGATIVE mg/dL
Specific Gravity, Urine: 1.006 (ref 1.005–1.030)
Urobilinogen, UA: 1 mg/dL (ref 0.0–1.0)
pH: 8 (ref 5.0–8.0)

## 2015-09-10 LAB — CK
CK TOTAL: 35027 U/L — AB (ref 49–397)
CK TOTAL: 16103 U/L — AB (ref 49–397)
Total CK: 19569 U/L — ABNORMAL HIGH (ref 49–397)

## 2015-09-10 LAB — CREATININE, FLUID (PLEURAL, PERITONEAL, JP DRAINAGE): Creat, Fluid: 2.7 mg/dL

## 2015-09-10 LAB — GLUCOSE, CAPILLARY
GLUCOSE-CAPILLARY: 143 mg/dL — AB (ref 65–99)
GLUCOSE-CAPILLARY: 159 mg/dL — AB (ref 65–99)
Glucose-Capillary: 135 mg/dL — ABNORMAL HIGH (ref 65–99)
Glucose-Capillary: 150 mg/dL — ABNORMAL HIGH (ref 65–99)

## 2015-09-10 LAB — URINE MICROSCOPIC-ADD ON

## 2015-09-10 LAB — SODIUM, URINE, RANDOM: Sodium, Ur: 51 mmol/L

## 2015-09-10 MED ORDER — POTASSIUM CHLORIDE 10 MEQ/100ML IV SOLN
10.0000 meq | INTRAVENOUS | Status: AC
  Administered 2015-09-10 (×4): 10 meq via INTRAVENOUS
  Filled 2015-09-10 (×3): qty 100

## 2015-09-10 MED ORDER — STERILE WATER FOR INJECTION IV SOLN
INTRAVENOUS | Status: DC
  Administered 2015-09-10: 09:00:00 via INTRAVENOUS
  Filled 2015-09-10: qty 9.7

## 2015-09-10 MED ORDER — GABAPENTIN 300 MG PO CAPS
300.0000 mg | ORAL_CAPSULE | Freq: Two times a day (BID) | ORAL | Status: DC
Start: 2015-09-11 — End: 2015-09-12
  Administered 2015-09-11 – 2015-09-12 (×2): 300 mg via ORAL
  Filled 2015-09-10 (×3): qty 1

## 2015-09-10 MED ORDER — METOCLOPRAMIDE HCL 5 MG/ML IJ SOLN
5.0000 mg | Freq: Four times a day (QID) | INTRAMUSCULAR | Status: DC
  Administered 2015-09-10 – 2015-09-12 (×11): 5 mg via INTRAVENOUS
  Filled 2015-09-10 (×10): qty 2

## 2015-09-10 MED ORDER — SODIUM CHLORIDE 0.9 % IV SOLN
INTRAVENOUS | Status: DC
  Administered 2015-09-10 – 2015-09-13 (×7): via INTRAVENOUS

## 2015-09-10 MED ORDER — FUROSEMIDE 10 MG/ML IJ SOLN
40.0000 mg | Freq: Once | INTRAMUSCULAR | Status: AC
  Administered 2015-09-10: 40 mg via INTRAVENOUS
  Filled 2015-09-10: qty 4

## 2015-09-10 MED ORDER — GABAPENTIN 300 MG PO CAPS
300.0000 mg | ORAL_CAPSULE | Freq: Once | ORAL | Status: AC
  Administered 2015-09-10: 300 mg via ORAL
  Filled 2015-09-10: qty 1

## 2015-09-10 MED ORDER — POLYETHYLENE GLYCOL 3350 17 G PO PACK
17.0000 g | PACK | Freq: Every day | ORAL | Status: DC
  Administered 2015-09-10 – 2015-09-13 (×4): 17 g via ORAL
  Filled 2015-09-10 (×4): qty 1

## 2015-09-10 MED ORDER — ACETAMINOPHEN 10 MG/ML IV SOLN
1000.0000 mg | Freq: Four times a day (QID) | INTRAVENOUS | Status: AC
  Administered 2015-09-10 – 2015-09-11 (×4): 1000 mg via INTRAVENOUS
  Filled 2015-09-10 (×7): qty 100

## 2015-09-10 NOTE — Progress Notes (Signed)
Urology Inpatient Progress Report RIGHT RENAL MASS 09/07/2015  Intv/Subj: Persistent gluteal pain Abdominal distention with associated nausea BM yesterday Walked minimally into hall yesterday Tired/Frustrated  Past Medical History  Diagnosis Date  . Hypertension   . Diabetes mellitus without complication ([place])   . Chronic kidney disease     bil renal mass  . Arthritis     osteoarthritis   Current Facility-Administered Medications  Medication Dose Route Frequency Provider Last Rate Last Dose  . acetaminophen (OFIRMEV) IV 1,000 mg  1,000 mg Intravenous 4 times per day Ardis Hughs, MD      . alum & mag hydroxide-simeth (MAALOX/MYLANTA) 200-200-20 MG/5ML suspension 15 mL  15 mL Oral Q6H PRN Festus Aloe, MD   15 mL at 09/09/15 0130  . amLODipine (NORVASC) tablet 5 mg  5 mg Oral Daily Debbrah Alar, PA-C   5 mg at 09/09/15 1245  . docusate sodium (COLACE) capsule 100 mg  100 mg Oral BID Festus Aloe, MD   100 mg at 09/09/15 2238  . gabapentin (NEURONTIN) capsule 300 mg  300 mg Oral Once Ardis Hughs, MD      . Derrill Memo ON 09/11/2015] gabapentin (NEURONTIN) capsule 300 mg  300 mg Oral BID Ardis Hughs, MD      . HYDROmorphone (DILAUDID) injection 0.5-1 mg  0.5-1 mg Intravenous Q2H PRN Debbrah Alar, PA-C   1 mg at 09/10/15 0210  . insulin aspart (novoLOG) injection 0-15 Units  0-15 Units Subcutaneous TID WC Debbrah Alar, PA-C   2 Units at 09/09/15 1731  . linagliptin (TRADJENTA) tablet 5 mg  5 mg Oral Daily Debbrah Alar, PA-C   5 mg at 09/09/15 0919  . metoCLOPramide (REGLAN) injection 5 mg  5 mg Intravenous 4 times per day Ardis Hughs, MD      . metoprolol succinate (TOPROL-XL) 24 hr tablet 50 mg  50 mg Oral Daily Debbrah Alar, PA-C   50 mg at 09/09/15 0920  . ondansetron (ZOFRAN) injection 4 mg  4 mg Intravenous Q4H PRN Debbrah Alar, PA-C   4 mg at 09/09/15 0950  . oxyCODONE (Oxy IR/ROXICODONE) immediate release tablet 5-15 mg  5-15 mg Oral Q4H PRN Ardis Hughs, MD   10 mg at 09/09/15 2238  . polyethylene glycol (MIRALAX / GLYCOLAX) packet 17 g  17 g Oral Daily Ardis Hughs, MD      . potassium chloride 10 mEq in 100 mL IVPB  10 mEq Intravenous Q1 Hr x 4 Ardis Hughs, MD      . sodium bicarbonate 150 mEq in dextrose 5 % 1,000 mL infusion   Intravenous Continuous Ardis Hughs, MD 175 mL/hr at 09/10/15 0438       Objective: Vital: Filed Vitals:   09/09/15 1439 09/09/15 2156 09/10/15 0152 09/10/15 0542  BP: 101/75 142/60 128/57 141/63  Pulse: 83 79 85   Temp: 98.9 F (37.2 C) 98.4 F (36.9 C) 98.1 F (36.7 C) 98.2 F (36.8 C)  TempSrc: Oral Oral Oral Oral  Resp: 20 20 20 18   Height:      Weight:      SpO2: 91% 91% 92% 96%   I/Os: I/O last 3 completed shifts: In: 8405.4 [P.O.:480; I.V.:7925.4] Out: 7195 [Urine:6600; Drains:595]  Physical Exam:  General: Patient is in no apparent distress Lungs: Normal respiratory effort, chest expands symmetrically. GI: Distended, + BS.  Incisions c/d/i Ext: lower extremities symmetric, pitting edema in ankles, + DP pulses  Lab Results:  Recent Labs  09/08/15 0515 09/09/15 0859 09/10/15 0451  WBC  --  19.6* 17.6*  HGB 14.8 13.1 12.9*  HCT 44.2 38.3* 38.3*    Recent Labs  09/09/15 0859 09/09/15 1700 09/10/15 0451  NA 127* 127* 128*  K 3.8 3.9 3.4*  CL 83* 81* 79*  CO2 35* 38* 40*  GLUCOSE 175* 140* 152*  BUN 28* 28* 31*  CREATININE 2.57* 2.60* 2.67*  CALCIUM 7.0* 7.1* 7.2*   No results for input(s): LABPT, INR in the last 72 hours. No results for input(s): LABURIN in the last 72 hours. Results for orders placed or performed during the hospital encounter of 08/31/15  Urine culture     Status: None   Collection Time: 08/31/15  2:12 PM  Result Value Ref Range Status   Specimen Description URINE, RANDOM  Final   Special Requests NONE  Final   Culture   Final    8,000 COLONIES/mL INSIGNIFICANT GROWTH Performed at [facility]    Report Status  09/02/2015 FINAL  Final    Studies/Results:   Assessment: 3 Days Post-Op - rhabdomyolysis, CK trending wrong direction, renal function stable, but not improving.  Pain persistent, slightly better. Nausea from abdominal distention/ileus.  Plan: Change fluids to 1/4 NS with 3 amps bicarb to improve NaCl Replete K Reglan for nausea/motility IV tylenol for pain - starting gabapentin as well. Continued PT Appreciate nephrology's input Trending lytes BID  Ardis Hughs 09/10/2015, 8:06 AM

## 2015-09-10 NOTE — Progress Notes (Signed)
Physical Therapy Treatment Patient Details Name: Lawrence [name] MRN: [id number] DOB: [birth year] Today's Date: 09/10/2015    History of Present Illness 59 yo male s/p lap partial nephrectomy 11/4. Post-op rhabdomyolysis hip/glut area L worse than R    PT Comments    Progressing slowly with mobility. Limited by pain.   Follow Up Recommendations  Home health PT     Equipment Recommendations  Rolling walker with 5" wheels (bari)    Recommendations for Other Services       Precautions / Restrictions Precautions Precautions: Fall Precaution Comments: drain R side abdomen Restrictions Weight Bearing Restrictions: No    Mobility  Bed Mobility Overal bed mobility: Needs Assistance Bed Mobility: Sit to Supine       Sit to supine: Min assist   General bed mobility comments: Assist for LEs. Increased time  Transfers Overall transfer level: Needs assistance Equipment used: Rolling walker (2 wheeled) Transfers: Sit to/from Stand Sit to Stand: Min guard         General transfer comment: close guard for safety.   Ambulation/Gait Ambulation/Gait assistance: Min guard Ambulation Distance (Feet): 15 Feet (x2) Assistive device: Rolling walker (2 wheeled) Gait Pattern/deviations: Step-to pattern;Trunk flexed;Antalgic     General Gait Details: close guard for safety.  distance limited by pain. VCs safety, proper use of walker   Stairs            Wheelchair Mobility    Modified Rankin (Stroke Patients Only)       Balance                                    Cognition Arousal/Alertness: Awake/alert Behavior During Therapy: WFL for tasks assessed/performed Overall Cognitive Status: Within Functional Limits for tasks assessed                      Exercises General Exercises - Lower Extremity Heel Slides: AAROM;Both;10 reps;Supine (pt used sheet to assist) Hip ABduction/ADduction: AAROM;Both;10 reps;Supine    General Comments         Pertinent Vitals/Pain Pain Assessment: 0-10 Pain Location: bil hip/glut areas, L worse than R Pain Descriptors / Indicators: Sore Pain Intervention(s): Monitored during session;RN gave pain meds during session    Home Living                      Prior Function            PT Goals (current goals can now be found in the care plan section) Progress towards PT goals: Progressing toward goals    Frequency  Min 3X/week    PT Plan Current plan remains appropriate    Co-evaluation             End of Session   Activity Tolerance: Patient limited by fatigue;Patient limited by pain Patient left: in bed;with call bell/phone within reach;with family/visitor present     Time: 2993-7169 PT Time Calculation (min) (ACUTE ONLY): 49 min  Charges:  $Gait Training: 8-22 mins $Therapeutic Exercise: 8-22 mins                    G Codes:      [name], MPT Pager: [phone]

## 2015-09-10 NOTE — Clinical Documentation Improvement (Signed)
Nephrology/Renal  Can the diagnosis of CKD be further specified?   CKD Stage I - GFR greater than or equal to 90  CKD Stage II - GFR 60-89  CKD Stage III - GFR 30-59  CKD Stage IV - GFR 15-29  CKD Stage V - GFR < 15  ESRD (End Stage Renal Disease)  Other condition  Unable to clinically determine   Supporting Information: : (risk factors, signs and symptoms, diagnostics, treatment) BUN 21, 23, 25, 28, 32; Creatinine 2.19, 2.57, 2.67: GFR started at 32 and now 26.  Stated in record that pt has mild CKD in setting of AKI.  Please exercise your independent, professional judgment when responding. A specific answer is not anticipated or expected.   Thank You, [place] [phone]

## 2015-09-10 NOTE — Progress Notes (Signed)
Patient ID: Lawrence [name], male   DOB: 06/02/1956, 59 y.o.   MRN: [id number]  [facility] Progress Note   Assessment/ Plan:   1. 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 I will send off for a urinalysis today. Renal function appears rather unchanged overnight but remains with excellent urine output and no acute electrolyte abnormalities. Given his worsening metabolic alkalosis and mild hypokalemia, I will discontinue sodium bicarbonate to prevent respiratory compromise and switch him over to normal saline. He does have some edema but excellent urine output-not to warrant furosemide at this time. 2. Acute rhabdomyolysis without features of compartment syndrome: Continue supportive therapy with intravenous fluids and pain management 3. Partial nephrectomy of the right upper kidney-pathology pending and will help determine management strategy for left renal lesion  4. Metabolic alkalosis: Iatrogenic from aggressive intravenous sodium bicarbonate therapy for rhabdomyolysis -discontinue sodium bicarbonate and switch over to isotonic normal saline.  Subjective:   Reports to be a little happier with pain control overnight-still having excruciating pain when he attempts to walk/move around.    Objective:   BP 141/63 mmHg  Pulse 85  Temp(Src) 98.2 F (36.8 C) (Oral)  Resp 18  Ht 6' (1.829 m)  Wt 151.048 kg (333 lb)  BMI 45.15 kg/m2  SpO2 96%  Intake/Output Summary (Last 24 hours) at 09/10/15 1249 Last data filed at 09/10/15 0643  Gross per 24 hour  Intake 5085.42 ml  Output   5205 ml  Net -119.58 ml   Weight change:   Physical Exam: Gen: Appears to be comfortable resting in bed CVS: Pulse regular in rate and rhythm, S1 and S2, Resp: Clear to auscultation-no distinct rales or rhonchi Abd: Soft, distended, nontender, high-pitched bowel sounds Ext: 1-2+ lower extremity edema  Imaging: No  results found.  Labs: BMET  Recent Labs Lab 09/07/15 1950 09/08/15 0010 09/08/15 0515 09/08/15 1545 09/09/15 0859 09/09/15 1700 09/10/15 0451  NA 136 137 135 130* 127* 127* 128*  K 5.8* 4.9 4.1 4.0 3.8 3.9 3.4*  CL 105 98* 96* 89* 83* 81* 79*  CO2 25 29 31  33* 35* 38* 40*  GLUCOSE 165* 181* 151* 134* 175* 140* 152*  BUN 21* 23* 25* 25* 28* 28* 31*  CREATININE 2.19* 2.57* 2.50* 2.66* 2.57* 2.60* 2.67*  CALCIUM 8.0* 7.9* 7.7* 7.6* 7.0* 7.1* 7.2*  PHOS  --   --   --  3.9  --   --   --    CBC  Recent Labs Lab 09/07/15 1407 09/08/15 0515 09/09/15 0859 09/10/15 0451  WBC  --   --  19.6* 17.6*  HGB 15.6 14.8 13.1 12.9*  HCT 47.9 44.2 38.3* 38.3*  MCV  --   --  83.6 85.5  PLT  --   --  165 175    Medications:    . acetaminophen  1,000 mg Intravenous 4 times per day  . amLODipine  5 mg Oral Daily  . docusate sodium  100 mg Oral BID  . [START ON 09/11/2015] gabapentin  300 mg Oral BID  . insulin aspart  0-15 Units Subcutaneous TID WC  . linagliptin  5 mg Oral Daily  . metoCLOPramide (REGLAN) injection  5 mg Intravenous 4 times per day  . metoprolol succinate  50 mg Oral Daily  . polyethylene glycol  17 g Oral Daily  . potassium chloride  10 mEq Intravenous Q1 Hr x 4   Elmarie Shiley, MD 09/10/2015,  12:49 PM

## 2015-09-10 NOTE — Progress Notes (Signed)
Per caregiver request- RT placed bifurcated 02 receptacle in PT room- RT hooked existing 2lpm [place] to 1 side. Caregiver states that they will use other side for CPAP 02. Please note that PT was awake and off 02 less than 40 seconds and dropped to 88% Sp02- RN aware.

## 2015-09-11 LAB — TYPE AND SCREEN
ABO/RH(D): O POS
Antibody Screen: NEGATIVE
UNIT DIVISION: 0
Unit division: 0

## 2015-09-11 LAB — BASIC METABOLIC PANEL
ANION GAP: 8 (ref 5–15)
Anion gap: 8 (ref 5–15)
BUN: 35 mg/dL — AB (ref 6–20)
BUN: 34 mg/dL — AB (ref 6–20)
CALCIUM: 8.2 mg/dL — AB (ref 8.9–10.3)
CHLORIDE: 88 mmol/L — AB (ref 101–111)
CO2: 38 mmol/L — ABNORMAL HIGH (ref 22–32)
CO2: 34 mmol/L — ABNORMAL HIGH (ref 22–32)
CREATININE: 2.43 mg/dL — AB (ref 0.61–1.24)
Calcium: 7.7 mg/dL — ABNORMAL LOW (ref 8.9–10.3)
Chloride: 92 mmol/L — ABNORMAL LOW (ref 101–111)
Creatinine, Ser: 2.26 mg/dL — ABNORMAL HIGH (ref 0.61–1.24)
GFR calc Af Amer: 32 mL/min — ABNORMAL LOW (ref >60)
GFR calc Af Amer: 35 mL/min — ABNORMAL LOW (ref >60)
GFR, EST NON AFRICAN AMERICAN: 28 mL/min — AB (ref >60)
GFR, EST NON AFRICAN AMERICAN: 30 mL/min — AB (ref >60)
GLUCOSE: 123 mg/dL — AB (ref 65–99)
Glucose, Bld: 123 mg/dL — ABNORMAL HIGH (ref 65–99)
Potassium: 3.3 mmol/L — ABNORMAL LOW (ref 3.5–5.1)
Potassium: 3.7 mmol/L (ref 3.5–5.1)
SODIUM: 134 mmol/L — AB (ref 135–145)
Sodium: 134 mmol/L — ABNORMAL LOW (ref 135–145)

## 2015-09-11 LAB — RENAL FUNCTION PANEL
Albumin: 2.5 g/dL — ABNORMAL LOW (ref 3.5–5.0)
Anion gap: 8 (ref 5–15)
BUN: 34 mg/dL — ABNORMAL HIGH (ref 6–20)
CALCIUM: 7.8 mg/dL — AB (ref 8.9–10.3)
CO2: 37 mmol/L — AB (ref 22–32)
CREATININE: 2.25 mg/dL — AB (ref 0.61–1.24)
Chloride: 89 mmol/L — ABNORMAL LOW (ref 101–111)
GFR calc Af Amer: 35 mL/min — ABNORMAL LOW (ref >60)
GFR calc non Af Amer: 31 mL/min — ABNORMAL LOW (ref >60)
GLUCOSE: 124 mg/dL — AB (ref 65–99)
Phosphorus: 3.1 mg/dL (ref 2.5–4.6)
Potassium: 3.3 mmol/L — ABNORMAL LOW (ref 3.5–5.1)
SODIUM: 134 mmol/L — AB (ref 135–145)

## 2015-09-11 LAB — GLUCOSE, CAPILLARY
GLUCOSE-CAPILLARY: 117 mg/dL — AB (ref 65–99)
GLUCOSE-CAPILLARY: 89 mg/dL (ref 65–99)
Glucose-Capillary: 137 mg/dL — ABNORMAL HIGH (ref 65–99)
Glucose-Capillary: 110 mg/dL — ABNORMAL HIGH (ref 65–99)
Glucose-Capillary: 100 mg/dL — ABNORMAL HIGH (ref 65–99)

## 2015-09-11 LAB — CBC
HCT: 39.7 % (ref 39.0–52.0)
Hemoglobin: 13.4 g/dL (ref 13.0–17.0)
MCH: 29.3 pg (ref 26.0–34.0)
MCHC: 33.8 g/dL (ref 30.0–36.0)
MCV: 86.9 fL (ref 78.0–100.0)
PLATELETS: 194 10*3/uL (ref 150–400)
RBC: 4.57 MIL/uL (ref 4.22–5.81)
RDW: 14.2 % (ref 11.5–15.5)
WBC: 13.4 10*3/uL — ABNORMAL HIGH (ref 4.0–10.5)

## 2015-09-11 LAB — CK
CK TOTAL: 12181 U/L — AB (ref 49–397)
CK TOTAL: 11775 U/L — AB (ref 49–397)

## 2015-09-11 MED ORDER — ACETAMINOPHEN 10 MG/ML IV SOLN
1000.0000 mg | Freq: Four times a day (QID) | INTRAVENOUS | Status: AC
  Administered 2015-09-11 – 2015-09-12 (×3): 1000 mg via INTRAVENOUS
  Filled 2015-09-11 (×5): qty 100

## 2015-09-11 MED ORDER — ACETAMINOPHEN 500 MG PO TABS: 1000.0000 mg | ORAL_TABLET | Freq: Four times a day (QID) | ORAL | Status: DC

## 2015-09-11 MED ORDER — POTASSIUM CHLORIDE 10 MEQ/100ML IV SOLN
10.0000 meq | INTRAVENOUS | Status: AC
  Administered 2015-09-11 (×6): 10 meq via INTRAVENOUS
  Filled 2015-09-11 (×6): qty 100

## 2015-09-11 MED ORDER — HEPARIN SODIUM (PORCINE) 5000 UNIT/ML IJ SOLN
5000.0000 [IU] | Freq: Three times a day (TID) | INTRAMUSCULAR | Status: DC
  Administered 2015-09-11 – 2015-09-13 (×7): 5000 [IU] via SUBCUTANEOUS
  Filled 2015-09-11 (×7): qty 1

## 2015-09-11 MED ORDER — FUROSEMIDE 10 MG/ML IJ SOLN
40.0000 mg | Freq: Once | INTRAMUSCULAR | Status: AC
  Administered 2015-09-11: 40 mg via INTRAVENOUS
  Filled 2015-09-11: qty 4

## 2015-09-11 MED ORDER — DIAZEPAM 5 MG PO TABS
10.0000 mg | ORAL_TABLET | Freq: Four times a day (QID) | ORAL | Status: DC | PRN
Start: 2015-09-11 — End: 2015-09-12
  Administered 2015-09-11 (×2): 10 mg via ORAL
  Filled 2015-09-11 (×2): qty 2

## 2015-09-11 MED ORDER — BISACODYL 10 MG RE SUPP
10.0000 mg | Freq: Every day | RECTAL | Status: DC | PRN
  Administered 2015-09-11: 10 mg via RECTAL
  Filled 2015-09-11: qty 1

## 2015-09-11 MED ORDER — ACETAMINOPHEN 10 MG/ML IV SOLN
1000.0000 mg | Freq: Four times a day (QID) | INTRAVENOUS | Status: DC
  Administered 2015-09-11: 1000 mg via INTRAVENOUS
  Filled 2015-09-11 (×4): qty 100

## 2015-09-11 NOTE — Progress Notes (Signed)
Patient ID: Lawrence [name], male   DOB: [birth year], 59 y.o.   MRN: [id number]  [facility] Progress Note   Assessment/ Plan:   1. AKI: renal function slightly better with improvement of creatinine and continued excellent urine output with forced diuresis-saline/Lasix. I will decrease the rate of intravenous fluids to 100 mL/h and continue to monitor urine output/labs for renal recovery. I wouldn't reduce furosemide to help mobilize some of his edema. 2. Acute rhabdomyolysis without features of compartment syndrome: CPK level trending down sluggishly-continue to follow with daily labs. 3. Partial nephrectomy of the right upper kidney-pathology pending and will help determine management strategy for left renal lesion  4. Metabolic alkalosis: Iatrogenic from aggressive intravenous sodium bicarbonate therapy for rhabdomyolysis -now improving after discontinuation of sodium bicarbonate and transition to normalsaline.  Subjective:   Reports to have had a more comfortable night- especially after passing flatus and having some decrease in abdominal discomfort. Still with some pain over left buttock- especially after walking.    Objective:   BP 141/70 mmHg  Pulse 72  Temp(Src) 97.4 F (36.3 C) (Oral)  Resp 20  Ht 6' (1.829 m)  Wt 151.048 kg (333 lb)  BMI 45.15 kg/m2  SpO2 95%  Intake/Output Summary (Last 24 hours) at 09/11/15 1356 Last data filed at 09/11/15 1330  Gross per 24 hour  Intake   4455 ml  Output   8870 ml  Net  -4415 ml   Weight change:   Physical Exam: Gen: Appears to be comfortable sitting up in his recliner CVS: Pulse regular in rate and rhythm, S1 and S2, Resp: Clear to auscultation-no distinct rales or rhonchi Abd: Soft, distended, nontender, high-pitched bowel sounds Ext: 1-2+ lower extremity edema  Imaging: No results found.  Labs: BMET  Recent Labs Lab 09/08/15 0515 09/08/15 1545 09/09/15 0859 09/09/15 1700 09/10/15 0451 09/10/15 1559  09/11/15 0432  NA 135 130* 127* 127* 128* 131* 134*  134*  K 4.1 4.0 3.8 3.9 3.4* 3.6 3.3*  3.3*  CL 96* 89* 83* 81* 79* 83* 88*  89*  CO2 31 33* 35* 38* 40* 40* 38*  37*  GLUCOSE 151* 134* 175* 140* 152* 141* 123*  124*  BUN 25* 25* 28* 28* 31* 32* 35*  34*  CREATININE 2.50* 2.66* 2.57* 2.60* 2.67* 2.57* 2.43*  2.25*  CALCIUM 7.7* 7.6* 7.0* 7.1* 7.2* 7.5* 7.7*  7.8*  PHOS  --  3.9  --   --   --   --  3.1   CBC  Recent Labs Lab 09/08/15 0515 09/09/15 0859 09/10/15 0451 09/11/15 0432  WBC  --  19.6* 17.6* 13.4*  HGB 14.8 13.1 12.9* 13.4  HCT 44.2 38.3* 38.3* 39.7  MCV  --  83.6 85.5 86.9  PLT  --  165 175 194    Medications:    . acetaminophen  1,000 mg Intravenous 4 times per day  . amLODipine  5 mg Oral Daily  . docusate sodium  100 mg Oral BID  . gabapentin  300 mg Oral BID  . heparin subcutaneous  5,000 Units Subcutaneous 3 times per day  . insulin aspart  0-15 Units Subcutaneous TID WC  . linagliptin  5 mg Oral Daily  . metoCLOPramide (REGLAN) injection  5 mg Intravenous 4 times per day  . metoprolol succinate  50 mg Oral Daily  . polyethylene glycol  17 g Oral Daily  . potassium chloride  10 mEq Intravenous Q1 Hr x 6   Elmarie Shiley, MD  09/11/2015, 1:56 PM

## 2015-09-11 NOTE — Progress Notes (Signed)
Physical Therapy Treatment Patient Details Name: Rena Sweeden MRN: [id number] DOB: [birth year] Today's Date: 09/11/2015    History of Present Illness 59 yo male s/p lap partial nephrectomy 11/4. Post-op rhabdomyolysis hip/glut area L worse than R    PT Comments    Progressing slowly with mobility. Overall pt was Min guard assist for standing and ambulation distance of ~100 feet x 2 on today. Encouraged pt to ambulate again later with nursing if able and to continue ROM exercises. Will continue to follow and plan to practice stair negotiation.   Follow Up Recommendations  Home health PT;Supervision/Assistance - 24 hour     Equipment Recommendations  Rolling walker with 5" wheels (bari)    Recommendations for Other Services       Precautions / Restrictions Precautions Precautions: Fall Precaution Comments: drain R side abdomen Restrictions Weight Bearing Restrictions: No    Mobility  Bed Mobility               General bed mobility comments: pt oob in recliner  Transfers Overall transfer level: Needs assistance Equipment used: Rolling walker (2 wheeled) Transfers: Sit to/from Stand Sit to Stand: Min guard         General transfer comment: close guard for safety.   Ambulation/Gait Ambulation/Gait assistance: Min guard Ambulation Distance (Feet): 100 Feet (x2) Assistive device: Rolling walker (2 wheeled) Gait Pattern/deviations: Trunk flexed;Step-to pattern;Step-through pattern;Antalgic     General Gait Details: close guard for safety.  distance limited by pain. VCs safety, proper use of walker. Seated rest breaks in between walks.    Stairs            Wheelchair Mobility    Modified Rankin (Stroke Patients Only)       Balance                                    Cognition Arousal/Alertness: Awake/alert Behavior During Therapy: WFL for tasks assessed/performed Overall Cognitive Status: Within Functional Limits for tasks  assessed                      Exercises General Exercises - Lower Extremity Long Arc Quad: AROM;Both;5 reps;Seated    General Comments        Pertinent Vitals/Pain Pain Assessment: 0-10 Pain Score: 5  Pain Location: bil hip/glut areas, L worse than R, with activity Pain Descriptors / Indicators: Sore;Tightness Pain Intervention(s): Monitored during session    Home Living                      Prior Function            PT Goals (current goals can now be found in the care plan section) Progress towards PT goals: Progressing toward goals (slowly)    Frequency  Min 3X/week    PT Plan Current plan remains appropriate    Co-evaluation             End of Session   Activity Tolerance: Patient limited by fatigue;Patient limited by pain Patient left: in chair;with call bell/phone within reach;with family/visitor present     Time: 6553-7482 PT Time Calculation (min) (ACUTE ONLY): 31 min  Charges:  $Gait Training: 23-37 mins                    G Codes:      [name], MPT Pager: [phone]

## 2015-09-11 NOTE — Progress Notes (Signed)
Urology Inpatient Progress Report RIGHT RENAL MASS 09/07/2015  Intv/Subj: Brighter today, has had several episodes of flatus and is less distended. Persistent gluteal pain, unable to really walk any significant distance yesterday with physical therapy. Slept better No nausea vomiting or chest pain   Past Medical History  Diagnosis Date  . Hypertension   . Diabetes mellitus without complication ([place])   . Chronic kidney disease     bil renal mass  . Arthritis     osteoarthritis   Current Facility-Administered Medications  Medication Dose Route Frequency Provider Last Rate Last Dose  . 0.9 %  sodium chloride infusion   Intravenous Continuous Elmarie Shiley, MD 175 mL/hr at 09/11/15 0039    . acetaminophen (OFIRMEV) IV 1,000 mg  1,000 mg Intravenous 4 times per day Ardis Hughs, MD      . alum & mag hydroxide-simeth (MAALOX/MYLANTA) 200-200-20 MG/5ML suspension 15 mL  15 mL Oral Q6H PRN Festus Aloe, MD   15 mL at 09/09/15 0130  . amLODipine (NORVASC) tablet 5 mg  5 mg Oral Daily Debbrah Alar, PA-C   5 mg at 09/10/15 1041  . bisacodyl (DULCOLAX) suppository 10 mg  10 mg Rectal Daily PRN Ardis Hughs, MD      . diazepam (VALIUM) tablet 10 mg  10 mg Oral Q6H PRN Ardis Hughs, MD      . docusate sodium (COLACE) capsule 100 mg  100 mg Oral BID Festus Aloe, MD   100 mg at 09/10/15 2142  . gabapentin (NEURONTIN) capsule 300 mg  300 mg Oral BID Ardis Hughs, MD      . heparin injection 5,000 Units  5,000 Units Subcutaneous 3 times per day Ardis Hughs, MD      . HYDROmorphone (DILAUDID) injection 0.5-1 mg  0.5-1 mg Intravenous Q2H PRN Debbrah Alar, PA-C   1 mg at 09/11/15 0528  . insulin aspart (novoLOG) injection 0-15 Units  0-15 Units Subcutaneous TID WC Debbrah Alar, PA-C   3 Units at 09/10/15 1700  . linagliptin (TRADJENTA) tablet 5 mg  5 mg Oral Daily Debbrah Alar, PA-C   5 mg at 09/10/15 1041  . metoCLOPramide (REGLAN) injection 5 mg  5 mg Intravenous 4  times per day Ardis Hughs, MD   5 mg at 09/11/15 0631  . metoprolol succinate (TOPROL-XL) 24 hr tablet 50 mg  50 mg Oral Daily Amanda Dancy, PA-C   50 mg at 09/10/15 1041  . ondansetron (ZOFRAN) injection 4 mg  4 mg Intravenous Q4H PRN Debbrah Alar, PA-C   4 mg at 09/09/15 0950  . oxyCODONE (Oxy IR/ROXICODONE) immediate release tablet 5-15 mg  5-15 mg Oral Q4H PRN Ardis Hughs, MD   10 mg at 09/11/15 0155  . polyethylene glycol (MIRALAX / GLYCOLAX) packet 17 g  17 g Oral Daily Ardis Hughs, MD   17 g at 09/10/15 1000  . potassium chloride 10 mEq in 100 mL IVPB  10 mEq Intravenous Q1 Hr x 6 Ardis Hughs, MD         Objective: Vital: Filed Vitals:   09/10/15 0542 09/10/15 1513 09/10/15 2237 09/11/15 0530  BP: 141/63 159/65 130/69 141/70  Pulse:  74 73 72  Temp: 98.2 F (36.8 C) 98.4 F (36.9 C) 98.5 F (36.9 C) 97.4 F (36.3 C)  TempSrc: Oral Oral Oral Oral  Resp: 18 19 20 20   Height:      Weight:      SpO2: 96% 93%  93% 95%   I/Os: I/O last 3 completed shifts: In: 7450.4 [P.O.:480; I.V.:5900.4; Other:770; IV Piggyback:300] Out: 56812 [Urine:9775; Drains:420]  Physical Exam:  General: Patient is in no apparent distress Lungs: Normal respiratory effort, chest expands symmetrically. GI: Distended, + BS.  Incisions c/d/i Ext: lower extremities symmetric, pitting edema in ankles, + DP pulses  Lab Results:  Recent Labs  09/09/15 0859 09/10/15 0451 09/11/15 0432  WBC 19.6* 17.6* 13.4*  HGB 13.1 12.9* 13.4  HCT 38.3* 38.3* 39.7    Recent Labs  09/10/15 0451 09/10/15 1559 09/11/15 0432  NA 128* 131* 134*  134*  K 3.4* 3.6 3.3*  3.3*  CL 79* 83* 88*  89*  CO2 40* 40* 38*  37*  GLUCOSE 152* 141* 123*  124*  BUN 31* 32* 35*  34*  CREATININE 2.67* 2.57* 2.43*  2.25*  CALCIUM 7.2* 7.5* 7.7*  7.8*   No results for input(s): LABPT, INR in the last 72 hours. No results for input(s): LABURIN in the last 72 hours. Results for orders placed  or performed during the hospital encounter of 08/31/15  Urine culture     Status: None   Collection Time: 08/31/15  2:12 PM  Result Value Ref Range Status   Specimen Description URINE, RANDOM  Final   Special Requests NONE  Final   Culture   Final    8,000 COLONIES/mL INSIGNIFICANT GROWTH Performed at [facility]    Report Status 09/02/2015 FINAL  Final    Studies/Results: JP creatinine-2.7 (serum)   Assessment: 4 Days Post-Op - rhabdomyolysis, creatinine and creatinine kinase are improving.  Pain persistent, slightly better. Abdominal distention is improving  Plan: Continue with aggressive hydration, expect that we will be able to track down soon, potentially this afternoon. Replete K Reglan for nausea/motility Continue IV Tylenol, gabapentin, adding Ativan as well Continued PT Appreciate nephrology's input Trending lytes BID  needs aggressive physical therapy  Ardis Hughs 09/11/2015, 8:56 AM

## 2015-09-11 NOTE — Progress Notes (Signed)
Pt places himself on/off CPAP. Pt is using his nasal mask and tubing from home and demonstrated to me the proper way to place mask on himself and cut machine on/off. Machine remains plugged to split O2 flowmeter with 4 Lpm flowing into circuit to maintain SpO2. Machine set his home settings, plugged into red outlet and humidifier filled with sterile water. RT will continue to monitor and assess as needed.

## 2015-09-11 NOTE — Progress Notes (Signed)
SCHEDULED IV ACETAMINOPHEN:  CONVERSION TO ORAL ROUTE to complete the ordered doses.  The Pharmacy and Therapeutics Committee has restricted administration of IV acetaminophen (with a 24 hr maximum duration) to patients who meet both of the following criteria:  Unable to tolerate oral or enteral medication  Contraindication to NSAIDs  Because the patient has taken other oral medications today, IV acetaminophen has been converted to PO to complete the course of therapy originally ordered.  If PO acetaminophen should be continued beyond the original stop time, please adjust the order accordingly using the "modify" function.   If you have questions about this conversion, please contact the pharmacy department.  Peggyann Juba, PharmD, BCPS 09/11/2015 10:33 AM

## 2015-09-11 NOTE — Progress Notes (Signed)
Pt places himself on/off CPAP.  Pt is using his nasal mask and tubing from home and demonstrated to me the proper way to place mask on himself and cut machine on/off.  Machine remains plugged to split O2 flowmeter with 2 Lpm flowing into circuit.  Machine set his home settings, plugged into red outlet and humidifier filled with sterile water.  RT will continue to monitor and assess as needed.

## 2015-09-12 LAB — RENAL FUNCTION PANEL
ALBUMIN: 2.8 g/dL — AB (ref 3.5–5.0)
ANION GAP: 11 (ref 5–15)
BUN: 33 mg/dL — AB (ref 6–20)
CHLORIDE: 94 mmol/L — AB (ref 101–111)
CO2: 29 mmol/L (ref 22–32)
Calcium: 8.3 mg/dL — ABNORMAL LOW (ref 8.9–10.3)
Creatinine, Ser: 2 mg/dL — ABNORMAL HIGH (ref 0.61–1.24)
GFR calc Af Amer: 41 mL/min — ABNORMAL LOW (ref >60)
GFR, EST NON AFRICAN AMERICAN: 35 mL/min — AB (ref >60)
Glucose, Bld: 110 mg/dL — ABNORMAL HIGH (ref 65–99)
PHOSPHORUS: 1.9 mg/dL — AB (ref 2.5–4.6)
POTASSIUM: 3.4 mmol/L — AB (ref 3.5–5.1)
Sodium: 134 mmol/L — ABNORMAL LOW (ref 135–145)

## 2015-09-12 LAB — CREATININE, FLUID (PLEURAL, PERITONEAL, JP DRAINAGE): CREAT FL: 2.1 mg/dL

## 2015-09-12 LAB — GLUCOSE, CAPILLARY
GLUCOSE-CAPILLARY: 105 mg/dL — AB (ref 65–99)
GLUCOSE-CAPILLARY: 115 mg/dL — AB (ref 65–99)
Glucose-Capillary: 127 mg/dL — ABNORMAL HIGH (ref 65–99)
Glucose-Capillary: 89 mg/dL (ref 65–99)

## 2015-09-12 LAB — CK: CK TOTAL: 10531 U/L — AB (ref 49–397)

## 2015-09-12 LAB — MAGNESIUM: Magnesium: 1.8 mg/dL (ref 1.7–2.4)

## 2015-09-12 MED ORDER — POTASSIUM CHLORIDE 10 MEQ/100ML IV SOLN
10.0000 meq | INTRAVENOUS | Status: DC
  Administered 2015-09-12: 10 meq via INTRAVENOUS
  Filled 2015-09-12: qty 100

## 2015-09-12 MED ORDER — POTASSIUM CHLORIDE CRYS ER 20 MEQ PO TBCR
40.0000 meq | EXTENDED_RELEASE_TABLET | Freq: Two times a day (BID) | ORAL | Status: AC
  Administered 2015-09-12 – 2015-09-13 (×3): 40 meq via ORAL
  Filled 2015-09-12 (×3): qty 2

## 2015-09-12 MED ORDER — GABAPENTIN 300 MG PO CAPS
300.0000 mg | ORAL_CAPSULE | Freq: Three times a day (TID) | ORAL | Status: DC
  Administered 2015-09-12 – 2015-09-13 (×2): 300 mg via ORAL
  Filled 2015-09-12 (×2): qty 1

## 2015-09-12 MED ORDER — CYCLOBENZAPRINE HCL 5 MG PO TABS
5.0000 mg | ORAL_TABLET | Freq: Two times a day (BID) | ORAL | Status: DC | PRN
  Administered 2015-09-13: 5 mg via ORAL
  Filled 2015-09-12: qty 1

## 2015-09-12 MED ORDER — FUROSEMIDE 10 MG/ML IJ SOLN
40.0000 mg | Freq: Once | INTRAMUSCULAR | Status: AC
  Administered 2015-09-12: 40 mg via INTRAVENOUS
  Filled 2015-09-12: qty 4

## 2015-09-12 NOTE — Progress Notes (Signed)
  Pt places himself on/off CPAP. Pt is using his nasal mask and tubing from home and demonstrated to me the proper way to place mask on himself and cut machine on/off. Machine remains plugged to split O2 flowmeter with 2 Lpm flowing into circuit to maintain SpO2. Machine set his home setting of 5 CPAP, plugged into red outlet and humidifier filled with sterile water. RT will continue to monitor and assess as needed.

## 2015-09-12 NOTE — Progress Notes (Signed)
Physical Therapy Treatment Patient Details Name: Lawrence [name] MRN: [id number] DOB: [birth year] Today's Date: 09/12/2015    History of Present Illness 59 yo male s/p lap partial nephrectomy 11/4. Post-op rhabdomyolysis hip/glut area L worse than R    PT Comments    Progressing with mobility. Practiced stairs-pt did well.   Follow Up Recommendations  Home health PT;Supervision/Assistance - 24 hour     Equipment Recommendations  Rolling walker with 5" wheels (bari)    Recommendations for Other Services       Precautions / Restrictions Precautions Precautions: Fall Precaution Comments: drain R side abdomen Restrictions Weight Bearing Restrictions: No    Mobility  Bed Mobility               General bed mobility comments: pt oob in recliner  Transfers Overall transfer level: Needs assistance Equipment used: Rolling walker (2 wheeled) Transfers: Sit to/from Stand Sit to Stand: Min guard         General transfer comment: close guard for safety.   Ambulation/Gait Ambulation/Gait assistance: Min guard Ambulation Distance (Feet): 50 Feet Assistive device: Rolling walker (2 wheeled) Gait Pattern/deviations: Step-to pattern;Decreased stride length     General Gait Details: close guard for safety.  distance limited by pain. Pt felt he walked too much on yesterday so lessened distance   Stairs Stairs: Yes Stairs assistance: Min guard Stair Management: Step to pattern;Forwards;One rail Right;One rail Left Number of Stairs: 2 General stair comments: close guard for safety. VCs safety, sequence.   Wheelchair Mobility    Modified Rankin (Stroke Patients Only)       Balance                                    Cognition Arousal/Alertness: Awake/alert Behavior During Therapy: WFL for tasks assessed/performed Overall Cognitive Status: Within Functional Limits for tasks assessed                      Exercises      General Comments         Pertinent Vitals/Pain Pain Assessment: 0-10 Pain Score: 5  Pain Location: bil hip/glue areas, L worse than R Pain Descriptors / Indicators: Sore Pain Intervention(s): Monitored during session;Repositioned    Home Living                      Prior Function            PT Goals (current goals can now be found in the care plan section) Progress towards PT goals: Progressing toward goals    Frequency  Min 3X/week    PT Plan Current plan remains appropriate    Co-evaluation             End of Session   Activity Tolerance: Patient tolerated treatment well Patient left: in chair;with call bell/phone within reach;with family/visitor present     Time: 0037-0488 PT Time Calculation (min) (ACUTE ONLY): 16 min  Charges:  $Gait Training: 8-22 mins                    G Codes:      [name], MPT Pager: [phone]

## 2015-09-12 NOTE — Progress Notes (Signed)
Patient ID: Lawrence [name], male   DOB: 1955-11-26, 59 y.o.   MRN: [id number]  [facility] Progress Note   Assessment/ Plan:   1. AKI on CKD stage 3A (baseline creatinine ~1.3): Continues to have excellent urine output overnight with ongoing normal saline-will decrease the rate of this further to 75 mL an hour. 2. Acute rhabdomyolysis without features of compartment syndrome: CPK level trending down sluggishly-continue to follow with daily labs. Will try Flexeril for muscle spasm. 3. Partial nephrectomy of the right upper kidney-pathology pending and will help determine management strategy for left renal lesion  4. Hypokalemia: Mild and secondary to brisk urine output/polyuria and status post loop diuretic-will replace by oral potassium. Labs indicate that magnesium level is acceptable.  Subjective:   Reports to be still having pain in his left buttock-tried diazepam yesterday for muscle spasms but with minimal benefit.    Objective:   BP 164/75 mmHg  Pulse 89  Temp(Src) 98.4 F (36.9 C) (Oral)  Resp 18  Ht 6' (1.829 m)  Wt 151.048 kg (333 lb)  BMI 45.15 kg/m2  SpO2 95%  Intake/Output Summary (Last 24 hours) at 09/12/15 1140 Last data filed at 09/12/15 1130  Gross per 24 hour  Intake   3365 ml  Output   5391 ml  Net  -2026 ml   Weight change:   Physical Exam: Gen: Appears to be comfortable sitting up in his recliner CVS: Pulse regular in rate and rhythm, S1 and S2, Resp: Clear to auscultation-no distinct rales or rhonchi Abd: Soft, distended, nontender, high-pitched bowel sounds Ext: 1-2+ lower extremity edema  Imaging: No results found.  Labs: BMET  Recent Labs Lab 09/08/15 1545 09/09/15 0859 09/09/15 1700 09/10/15 0451 09/10/15 1559 09/11/15 0432 09/11/15 1830 09/12/15 0440  NA 130* 127* 127* 128* 131* 134*  134* 134* 134*  K 4.0 3.8 3.9 3.4* 3.6 3.3*  3.3* 3.7 3.4*  CL 89* 83* 81* 79* 83* 88*  89* 92* 94*  CO2 33* 35* 38* 40* 40* 38*   37* 34* 29  GLUCOSE 134* 175* 140* 152* 141* 123*  124* 123* 110*  BUN 25* 28* 28* 31* 32* 35*  34* 34* 33*  CREATININE 2.66* 2.57* 2.60* 2.67* 2.57* 2.43*  2.25* 2.26* 2.00*  CALCIUM 7.6* 7.0* 7.1* 7.2* 7.5* 7.7*  7.8* 8.2* 8.3*  PHOS 3.9  --   --   --   --  3.1  --  1.9*   CBC  Recent Labs Lab 09/08/15 0515 09/09/15 0859 09/10/15 0451 09/11/15 0432  WBC  --  19.6* 17.6* 13.4*  HGB 14.8 13.1 12.9* 13.4  HCT 44.2 38.3* 38.3* 39.7  MCV  --  83.6 85.5 86.9  PLT  --  165 175 194    Medications:    . amLODipine  5 mg Oral Daily  . docusate sodium  100 mg Oral BID  . gabapentin  300 mg Oral BID  . heparin subcutaneous  5,000 Units Subcutaneous 3 times per day  . insulin aspart  0-15 Units Subcutaneous TID WC  . linagliptin  5 mg Oral Daily  . metoCLOPramide (REGLAN) injection  5 mg Intravenous 4 times per day  . metoprolol succinate  50 mg Oral Daily  . polyethylene glycol  17 g Oral Daily  . potassium chloride  40 mEq Oral BID   Elmarie Shiley, MD 09/12/2015, 11:40 AM

## 2015-09-12 NOTE — Progress Notes (Signed)
Pain improving Appetite better Moving with PT - was able to walk steps Catheter removed - voiding independently  AFVSS Urine output brisk Abdomen less distended Incisions is c/d/i Drain - serosanginous output Improving lower extremity edema   Recent Labs  09/10/15 0451 09/11/15 0432  WBC 17.6* 13.4*  HGB 12.9* 13.4  HCT 38.3* 39.7    Recent Labs  09/11/15 0432 09/11/15 1830 09/12/15 0440  NA 134*  134* 134* 134*  K 3.3*  3.3* 3.7 3.4*  CL 88*  89* 92* 94*  CO2 38*  37* 34* 29  GLUCOSE 123*  124* 123* 110*  BUN 35*  34* 34* 33*  CREATININE 2.43*  2.25* 2.26* 2.00*  CALCIUM 7.7*  7.8* 8.2* 8.3*   No results for input(s): LABPT, INR in the last 72 hours. No results for input(s): PSA in the last 72 hours. No results for input(s): LABURIN in the last 72 hours. 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 Rhabdomyolyses - persistent pain but improving slightly, CK slowly trending down Improving acute renal injury Plan: PO pain meds Continued PT Trend lytes/CK - appreciate Nephrologys help! Drain for creatinine- d/c in AM. Potentially ready for discharge tomorrow AM.

## 2015-09-13 LAB — BASIC METABOLIC PANEL
ANION GAP: 9 (ref 5–15)
BUN: 37 mg/dL — ABNORMAL HIGH (ref 6–20)
CALCIUM: 8.3 mg/dL — AB (ref 8.9–10.3)
CO2: 28 mmol/L (ref 22–32)
CREATININE: 1.88 mg/dL — AB (ref 0.61–1.24)
Chloride: 97 mmol/L — ABNORMAL LOW (ref 101–111)
GFR calc non Af Amer: 38 mL/min — ABNORMAL LOW (ref >60)
GFR, EST AFRICAN AMERICAN: 44 mL/min — AB (ref >60)
Glucose, Bld: 197 mg/dL — ABNORMAL HIGH (ref 65–99)
Potassium: 3.5 mmol/L (ref 3.5–5.1)
SODIUM: 134 mmol/L — AB (ref 135–145)

## 2015-09-13 LAB — GLUCOSE, CAPILLARY
GLUCOSE-CAPILLARY: 82 mg/dL (ref 65–99)
Glucose-Capillary: 106 mg/dL — ABNORMAL HIGH (ref 65–99)

## 2015-09-13 LAB — CK: CK TOTAL: 5258 U/L — AB (ref 49–397)

## 2015-09-13 MED ORDER — OXYCODONE HCL 5 MG PO TABS: 5.0000 mg | ORAL_TABLET | ORAL | Status: DC | PRN

## 2015-09-13 MED ORDER — DOCUSATE SODIUM 100 MG PO CAPS
100.0000 mg | ORAL_CAPSULE | Freq: Two times a day (BID) | ORAL | Status: DC
Start: 2015-09-13 — End: 2015-10-15

## 2015-09-13 MED ORDER — FUROSEMIDE 10 MG/ML IJ SOLN
40.0000 mg | Freq: Once | INTRAMUSCULAR | Status: AC
  Administered 2015-09-13: 40 mg via INTRAVENOUS
  Filled 2015-09-13: qty 4

## 2015-09-13 MED ORDER — CYCLOBENZAPRINE HCL 5 MG PO TABS: 5.0000 mg | ORAL_TABLET | Freq: Two times a day (BID) | ORAL | Status: DC | PRN

## 2015-09-13 MED ORDER — SODIUM CHLORIDE 0.9 % IV SOLN
12.5000 mg | Freq: Four times a day (QID) | INTRAVENOUS | Status: DC | PRN
  Filled 2015-09-13: qty 0.5

## 2015-09-13 MED ORDER — POTASSIUM CHLORIDE CRYS ER 20 MEQ PO TBCR
40.0000 meq | EXTENDED_RELEASE_TABLET | Freq: Two times a day (BID) | ORAL | Status: DC
  Administered 2015-09-13: 40 meq via ORAL
  Filled 2015-09-13: qty 2

## 2015-09-13 MED ORDER — GABAPENTIN 300 MG PO CAPS: 300.0000 mg | ORAL_CAPSULE | Freq: Three times a day (TID) | ORAL | Status: DC

## 2015-09-13 NOTE — Progress Notes (Signed)
Persistent left hip pain, but improving Hiccups since last night. JP drain removed this AM. No nausea/SOB/CP Voiding without issue.  AFVSS Urine output brisk Abdomen less distended Incisions is c/d/i Improving lower extremity edema - pitting   Recent Labs  09/11/15 0432  WBC 13.4*  HGB 13.4  HCT 39.7    Recent Labs  09/11/15 0432 09/11/15 1830 09/12/15 0440  NA 134*  134* 134* 134*  K 3.3*  3.3* 3.7 3.4*  CL 88*  89* 92* 94*  CO2 38*  37* 34* 29  GLUCOSE 123*  124* 123* 110*  BUN 35*  34* 34* 33*  CREATININE 2.43*  2.25* 2.26* 2.00*  CALCIUM 7.7*  7.8* 8.2* 8.3*   No results for input(s): LABPT, INR in the last 72 hours. No results for input(s): PSA in the last 72 hours. No results for input(s): LABURIN in the last 72 hours. 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 Rhabdomyolyses - persistent pain but improving slightly, CK slowly trending down Improving acute renal injury  LABS pending today   Plan: PO pain meds Continued PT Trend lytes/CK - appreciate Nephrologys help!  Hoping to d/c this PM with home health.  Dc based on todays labs which are pending.

## 2015-09-13 NOTE — Progress Notes (Addendum)
Patient ID: Lawrence [name], male   DOB: 03/01/1956, 59 y.o.   MRN: [id number]  [facility] Progress Note   Assessment/ Plan:   1. AKI on CKD stage 3A (baseline creatinine ~1.3): Continues to have excellent urine output overnight with imrpving renal function. Mild hypokalemia is due to brisk UOP and diuretic use and anticipated to improve off fluids--will supplement briefly. Would not discharge on furosemide or olmesartan (the latter can be restarted next week after follow up with PCP) 2. Acute rhabdomyolysis without features of compartment syndrome: CPK level trending down -continue to follow with labs next week. Symptomatically better. 3. Partial nephrectomy of the right upper kidney-pathology pending and will help determine management strategy for left renal lesion. For OP f/u with urology next week 4. Hypokalemia: Mild and secondary to brisk urine output/polyuria and status post loop diuretic-will replace by oral potassium. Labs indicate that magnesium level is acceptable.  In light of his improving renal function and mobility- I agree with plans for DC today. I have set him up for follow-up with me next week-09/18/15 unless he can see his primary care provider and have labs done to follow-up on renal recovery/improvement of CPK. Given that this was acute renal failure-I anticipate renal recovery back to his baseline creatinine of around 1.3.   Subjective:   Improved left buttock pain and mobility- excited at the prospect of leaving later today.   Objective:   BP 128/64 mmHg  Pulse 75  Temp(Src) 98.8 F (37.1 C) (Oral)  Resp 18  Ht 6' (1.829 m)  Wt 151.048 kg (333 lb)  BMI 45.15 kg/m2  SpO2 95%  Intake/Output Summary (Last 24 hours) at 09/13/15 1125 Last data filed at 09/13/15 0819  Gross per 24 hour  Intake 2187.92 ml  Output    855 ml  Net 1332.92 ml   Weight change:   Physical Exam: Gen: Appears comfortable sitting up in his recliner CVS: Pulse regular in rate  and rhythm, S1 and S2, Resp: Clear to auscultation-no distinct rales or rhonchi Abd: Soft, distended, nontender, high-pitched bowel sounds Ext: 1-2+ lower extremity edema  Imaging: No results found.  Labs: BMET  Recent Labs Lab 09/08/15 1545  09/09/15 1700 09/10/15 0451 09/10/15 1559 09/11/15 0432 09/11/15 1830 09/12/15 0440 09/13/15 0905  NA 130*  < > 127* 128* 131* 134*  134* 134* 134* 134*  K 4.0  < > 3.9 3.4* 3.6 3.3*  3.3* 3.7 3.4* 3.5  CL 89*  < > 81* 79* 83* 88*  89* 92* 94* 97*  CO2 33*  < > 38* 40* 40* 38*  37* 34* 29 28  GLUCOSE 134*  < > 140* 152* 141* 123*  124* 123* 110* 197*  BUN 25*  < > 28* 31* 32* 35*  34* 34* 33* 37*  CREATININE 2.66*  < > 2.60* 2.67* 2.57* 2.43*  2.25* 2.26* 2.00* 1.88*  CALCIUM 7.6*  < > 7.1* 7.2* 7.5* 7.7*  7.8* 8.2* 8.3* 8.3*  PHOS 3.9  --   --   --   --  3.1  --  1.9*  --   < > = values in this interval not displayed. CBC  Recent Labs Lab 09/08/15 0515 09/09/15 0859 09/10/15 0451 09/11/15 0432  WBC  --  19.6* 17.6* 13.4*  HGB 14.8 13.1 12.9* 13.4  HCT 44.2 38.3* 38.3* 39.7  MCV  --  83.6 85.5 86.9  PLT  --  165 175 194    Medications:    .  amLODipine  5 mg Oral Daily  . docusate sodium  100 mg Oral BID  . furosemide  40 mg Intravenous Once  . gabapentin  300 mg Oral TID  . heparin subcutaneous  5,000 Units Subcutaneous 3 times per day  . insulin aspart  0-15 Units Subcutaneous TID WC  . linagliptin  5 mg Oral Daily  . metoprolol succinate  50 mg Oral Daily  . polyethylene glycol  17 g Oral Daily  . potassium chloride  40 mEq Oral BID   Elmarie Shiley, MD 09/13/2015, 11:25 AM

## 2015-09-13 NOTE — Discharge Summary (Signed)
Date of admission: 09/07/2015  Date of discharge: 09/13/2015  Admission diagnosis: right renal mass  Discharge diagnosis: right cystic renal cell carcinoma, T1a.  Acute renal insufficiency, Rhabdomyolysis, obesity.  Secondary diagnoses:  Patient Active Problem List   Diagnosis Date Noted  . Renal mass 09/07/2015  . Varicose veins of lower extremities with other complications 07/37/1062  . 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 Venous insufficiency 09/06/2012    History and Physical: For full details, please see admission history and physical. Briefly, Lawrence [name] is a 59 y.o. year old patient with a bilateral renal masses.  He presented for right partial nephrectomy.   Hospital Course: Patient tolerated the procedure well.  He was then transferred to the floor after an uneventful PACU stay.   In the PACU he complained of intense bilateral buttock pain.  His urine was brown colored.  CK suggested that he had developed Rhabdo resulting from his position during the surgery. He was seen by nephrology who helped manage his acute renal injury due to his rhabdo.  Over the next 5 days his slowly improved and his creatinine improved nearly to baseline.  He worked with PT and was requiring a front wheel walker to get around due to pain.   On POD#5  he had met discharge criteria: was eating a regular diet, was up and ambulating independently,  pain was well controlled, was voiding without a catheter, and was ready to for discharge.   Laboratory values:   Recent Labs  09/11/15 0432  WBC 13.4*  HGB 13.4  HCT 39.7    Recent Labs  09/11/15 1830 09/12/15 0440 09/13/15 0905  NA 134* 134* 134*  K 3.7 3.4* 3.5  CL 92* 94* 97*  CO2 34* 29 28  GLUCOSE 123* 110* 197*  BUN 34* 33* 37*  CREATININE 2.26* 2.00* 1.88*  CALCIUM 8.2* 8.3* 8.3*   No results for input(s): LABPT, INR in the last 72 hours. No results for input(s): LABURIN in the last 72  hours. Results for orders placed or performed during the hospital encounter of 08/31/15  Urine culture     Status: None   Collection Time: 08/31/15  2:12 PM  Result Value Ref Range Status   Specimen Description URINE, RANDOM  Final   Special Requests NONE  Final   Culture   Final    8,000 COLONIES/mL INSIGNIFICANT GROWTH Performed at [facility]    Report Status 09/02/2015 FINAL  Final    Disposition: Home  Discharge instruction: The patient was instructed to be ambulatory but told to refrain from heavy lifting, strenuous activity, or driving.   Discharge medications:    Medication List    STOP taking these medications        Fish Oil 1200 MG Caps     OSTEO BI-FLEX ADV TRIPLE ST PO     OVER THE COUNTER MEDICATION     PRENATAL-U 106.5-1 MG Caps     vitamin B-12 1000 MCG tablet  Commonly known as:  CYANOCOBALAMIN     vitamin C 500 MG tablet  Commonly known as:  ASCORBIC ACID      TAKE these medications        amLODipine 5 MG tablet  Commonly known as:  NORVASC  Take 5 mg by mouth daily.     cyclobenzaprine 5 MG tablet  Commonly known as:  FLEXERIL  Take 1 tablet (5 mg total) by mouth 2 (two) times daily as needed  for muscle spasms.     docusate sodium 100 MG capsule  Commonly known as:  COLACE  Take 1 capsule (100 mg total) by mouth 2 (two) times daily.     fenofibrate 160 MG tablet  Take 160 mg by mouth daily.     gabapentin 300 MG capsule  Commonly known as:  NEURONTIN  Take 1 capsule (300 mg total) by mouth 3 (three) times daily.     metoprolol succinate 50 MG 24 hr tablet  Commonly known as:  TOPROL-XL  Take 50 mg by mouth daily. Take with or immediately following a meal.     mupirocin ointment 2 %  Commonly known as:  BACTROBAN     olmesartan 20 MG tablet  Commonly known as:  BENICAR  Take 20 mg by mouth daily.     ONE TOUCH ULTRA 2 W/DEVICE Kit     oxyCODONE 5 MG immediate release tablet  Commonly known as:  Oxy IR/ROXICODONE   Take 1-3 tablets (5-15 mg total) by mouth every 4 (four) hours as needed for moderate pain.     sitaGLIPtin 100 MG tablet  Commonly known as:  JANUVIA  Take 100 mg by mouth daily.     testosterone 50 MG/5GM (1%) Gel  Commonly known as:  ANDROGEL  Place 5 g onto the skin daily. 2 pumps to each shoulder daily (20.20m per pump=846mtotal/day)        Followup:  Follow-up Information    Follow up with HEArdis HughsMD On 09/20/2015.   Specialty:  Urology   Why:  3pm, For wound re-check   Contact information:   [address] [id number]     Follow up with PAUlla Potash MD On 09/18/2015.   Specialty:  Nephrology   Why:  APPOINTMENT AT 1:15PM (MAY SKIP IF YOU CAN BE SEEN AT PCP OFFICE WITH LABS)   Contact information:   [address] [phone]

## 2015-09-21 ENCOUNTER — Other Ambulatory Visit: Payer: Self-pay | Admitting: Urology

## 2015-09-21 DIAGNOSIS — N2889 Other specified disorders of kidney and ureter: Secondary | ICD-10-CM

## 2015-09-26 ENCOUNTER — Other Ambulatory Visit: Payer: Self-pay | Admitting: Interventional Radiology

## 2015-09-26 ENCOUNTER — Ambulatory Visit
Admission: RE | Admit: 2015-09-26 | Discharge: 2015-09-26 | Disposition: A | Discharge status: Home / Self Care | Payer: BLUE CROSS/BLUE SHIELD | Source: Ambulatory Visit | Attending: Urology | Admitting: Urology

## 2015-09-26 DIAGNOSIS — C642 Malignant neoplasm of left kidney, except renal pelvis: Secondary | ICD-10-CM

## 2015-09-26 DIAGNOSIS — D3002 Benign neoplasm of left kidney: Secondary | ICD-10-CM

## 2015-09-26 DIAGNOSIS — N2889 Other specified disorders of kidney and ureter: Secondary | ICD-10-CM

## 2015-09-26 NOTE — Consult Note (Signed)
Chief Complaint: Patient was seen in consultation today for No chief complaint on file.  at the request of Herrick,Benjamin W  Referring Physician(s): Herrick,Benjamin W  History of Present Illness: Lawrence [name] is a 59 y.o. male presents today as a referral from Dr. Louis Meckel, MD for further discussion of possible percutaneous ablation of a left renal neoplasm.   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 Louis Meckel on 09/07/2015. Surgical margins were negative.  His postoperative course was complicated by rhabdomyolysis thought secondary to prolonged anesthesia time. Otherwise, he has recovered well.  He is currently walking with a cane. He denies any flank pain, hematuria, or recent weight loss. He denies any constitutional symptoms.   The lesion in the posterior aspect of the lower pole of the left kidney measures approximately 2.3 x 2.3 cm which is slightly enlarged compared to 1 cm on a prior MRI dated 01/11/2014. This interval growth and continued enhancement is certainly suspicious for a synchronous second renal cell carcinoma.    Past Medical History  Diagnosis Date  . Hypertension   . Diabetes mellitus without complication ([place])   . Chronic kidney disease     bil renal mass  . Arthritis     osteoarthritis    Past Surgical History  Procedure Laterality Date  . Left gsv laser ablation    . Tonsillectomy    . Robotic assited partial nephrectomy Right 09/07/2015    Procedure: RIGHT ROBOTIC ASSITED PARTIAL NEPHRECTOMY;  Surgeon: Ardis Hughs, MD;  Location: [facility];  Service: Urology;  Laterality: Right;    Allergies: Review of patient's allergies indicates no known allergies.  Medications: Prior to Admission medications   Medication Sig Start Date End Date Taking? Authorizing Provider  amLODipine (NORVASC) 5 MG tablet Take 5 mg by mouth daily.    Yes Historical Provider, MD  Blood Glucose Monitoring Suppl (ONE TOUCH ULTRA 2) W/DEVICE KIT  08/26/12  Yes Historical Provider, MD  cyclobenzaprine (FLEXERIL) 5 MG tablet Take 1 tablet (5 mg total) by mouth 2 (two) times daily as needed for muscle spasms. 09/13/15  Yes Ardis Hughs, MD  docusate sodium (COLACE) 100 MG capsule Take 1 capsule (100 mg total) by mouth 2 (two) times daily. 09/13/15  Yes Ardis Hughs, MD  fenofibrate 160 MG tablet Take 160 mg by mouth daily.  08/10/12  Yes Historical Provider, MD  gabapentin (NEURONTIN) 300 MG capsule Take 1 capsule (300 mg total) by mouth 3 (three) times daily. 09/13/15  Yes Ardis Hughs, MD  metoprolol succinate (TOPROL-XL) 50 MG 24 hr tablet Take 50 mg by mouth daily. Take with or immediately following a meal.   Yes Historical Provider, MD  oxyCODONE (OXY IR/ROXICODONE) 5 MG immediate release tablet Take 1-3 tablets (5-15 mg total) by mouth every 4 (four) hours as needed for moderate pain. 09/13/15  Yes Ardis Hughs, MD  Prenatal w/o A Vit-Fe Fum-FA (PRENATAL-U) 106.5-1 MG CAPS  07/25/15  Yes Historical Provider, MD  sitaGLIPtin (JANUVIA) 100 MG tablet Take 100 mg by mouth daily.   Yes Historical Provider, MD  testosterone (ANDROGEL) 50 MG/5GM (1%) GEL Place 5 g onto the skin daily. 2 pumps to each shoulder daily (20.74m per pump=868mtotal/day)   Yes Historical Provider, MD     Family History  Problem Relation Age of Onset  . Cancer Father   . Hyperlipidemia Brother  Social History   Social History  . Marital Status: Married    Spouse Name: N/A  . Number of Children: N/A  . Years of Education: N/A   Social History Main Topics  . Smoking status: Former Smoker    Quit date: 12/04/2004  . Smokeless tobacco: None  . Alcohol Use: Yes     Comment: "once in a blue moon" social drinker  . Drug Use: No  . Sexual Activity: Not Asked   Other Topics Concern  . None   Social History Narrative    ECOG Status: 0 -  Asymptomatic  Review of Systems: A 12 point ROS discussed and pertinent positives are indicated in the HPI above.  All other systems are negative.  Review of Systems  Vital Signs: BP 156/76 mmHg  Pulse 82  Temp(Src) 97.8 F (36.6 C)  Resp 18  Ht 6' (1.829 m)  Wt 330 lb (149.687 kg)  BMI 44.75 kg/m2  SpO2 98%  Physical Exam  Constitutional: He is oriented to person, place, and time. He appears well-developed and well-nourished. No distress.  HENT:  Head: Normocephalic and atraumatic.  Eyes: No scleral icterus.  Cardiovascular: Normal rate.   Pulmonary/Chest: Effort normal.  Abdominal: Soft.  Neurological: He is alert and oriented to person, place, and time.  Skin: Skin is warm and dry.  Psychiatric: He has a normal mood and affect. His behavior is normal.  Nursing note and vitals reviewed.  Imaging: No results found.  Labs:  CBC:  Recent Labs  08/31/15 1510  09/08/15 0515 09/09/15 0859 09/10/15 0451 09/11/15 0432  WBC 9.3  --   --  19.6* 17.6* 13.4*  HGB 16.4  < > 14.8 13.1 12.9* 13.4  HCT 49.3  < > 44.2 38.3* 38.3* 39.7  PLT 201  --   --  165 175 194  < > = values in this interval not displayed.  COAGS: No results for input(s): INR, APTT in the last 8760 hours.  BMP:  Recent Labs  09/11/15 0432 09/11/15 1830 09/12/15 0440 09/13/15 0905  NA 134*  134* 134* 134* 134*  K 3.3*  3.3* 3.7 3.4* 3.5  CL 88*  89* 92* 94* 97*  CO2 38*  37* 34* 29 28  GLUCOSE 123*  124* 123* 110* 197*  BUN 35*  34* 34* 33* 37*  CALCIUM 7.7*  7.8* 8.2* 8.3* 8.3*  CREATININE 2.43*  2.25* 2.26* 2.00* 1.88*  GFRNONAA 28*  31* 30* 35* 38*  GFRAA 32*  35* 35* 41* 44*    LIVER FUNCTION TESTS:  Recent Labs  08/31/15 1510 09/08/15 1545 09/11/15 0432 09/12/15 0440  BILITOT 0.8  --   --   --   AST 31  --   --   --   ALT 42  --   --   --   ALKPHOS 46  --   --   --   PROT 7.3  --   --   --   ALBUMIN 4.1 3.5 2.5* 2.8*    TUMOR MARKERS: No results for  input(s): AFPTM, CEA, CA199, CHROMGRNA in the last 8760 hours.  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  Surgical resection went well for his right lesion although he did have rhabdomyolysis afterwards, possibly due to the duration  of anesthesia.  We discussed the risks, benefits and differences between a second robotic-assisted laparoscopic nephrectomy for his remaining left renal lesion versus percutaneous ablation. We discussed the differences between cryotherapy and thermal/heat based ablation.  Mr. Riel understands that heat based ablation is just as if not more effective than cryoablation and is also faster requiring less anesthesia time. Cryotherapy while also very effective takes longer but is safer in certain situations. Luckily, Mr. Bolin left renal lesion is exophytic and in the posterior aspect of the lower pole. There are no surrounding structures at risk and the lesion is fairly remote from the collecting system. I think he would be an excellent candidate for percutaneous thermal ablation. Further, I think we could keep his anesthesia time to less than 60 minutes with a maximum of about 90 minutes.  1.) Schedule for percutaneous thermal ablation of the left renal lesion. Please note patient would like the procedure to be performed before 11/04/2015 to take advantage of his completed deductible 02/21/2015.  Thank you for this interesting consult.  I greatly enjoyed meeting Payten Beaumier and look forward to participating in their care.  A copy of this report was sent to the requesting provider on this date.  SignedJacqulynn Cadet 09/26/2015, 10:01 AM   I spent a total of 40 Minutes  in face to face in clinical consultation, greater than 50%  of which was counseling/coordinating care for left renal neoplasm.

## 2015-10-10 ENCOUNTER — Other Ambulatory Visit: Payer: Self-pay | Admitting: Radiology

## 2015-10-13 NOTE — Patient Instructions (Addendum)
Lawrence [name]  10/13/2015   Your procedure is scheduled on: 10-17-15  Report to [facility] Main  Entrance and check in at the Radiology Department at 6:30 AM.  Then take [facility] to 3rd floor to short stay.  Call this number if you have problems the morning of surgery [phone]   Remember: ONLY 1 PERSON MAY GO WITH YOU TO SHORT STAY TO GET  READY MORNING OF YOUR SURGERY.  Do not eat food or drink liquids :After Midnight.     Take these medicines the morning of surgery with A SIP OF WATER: Amlodipine (Norvasc), Metoprolol (Toprol) DO NOT TAKE ANY DIABETIC MEDICATIONS DAY OF YOUR SURGERY                               You may not have any metal on your body including hair pins and              piercings  Do not wear jewelry, lotions, powders or perfumes, deodorant               Men may shave face and neck.   Do not bring valuables to the hospital. [place].  Contacts, dentures or bridgework may not be worn into surgery.  Leave suitcase in the car. After surgery it may be brought to your room.    Marland Kitchen   Special Instructions: coughing and deep breathing exercises, leg exercises              Please read over the following fact sheets you were given: _____________________________________________________________________             [facility] - Preparing for Surgery Before surgery, you can play an important role.  Because skin is not sterile, your skin needs to be as free of germs as possible.  You can reduce the number of germs on your skin by washing with CHG (chlorahexidine gluconate) soap before surgery.  CHG is an antiseptic cleaner which kills germs and bonds with the skin to continue killing germs even after washing. Please DO NOT use if you have an allergy to CHG or antibacterial soaps.  If your skin becomes reddened/irritated stop using the CHG and inform your nurse when you arrive at Short  Stay. Do not shave (including legs and underarms) for at least 48 hours prior to the first CHG shower.  You may shave your face/neck. Please follow these instructions carefully:  1.  Shower with CHG Soap the night before surgery and the  morning of Surgery.  2.  If you choose to wash your hair, wash your hair first as usual with your  normal  shampoo.  3.  After you shampoo, rinse your hair and body thoroughly to remove the  shampoo.                           4.  Use CHG as you would any other liquid soap.  You can apply chg directly  to the skin and wash                       Gently with a scrungie or clean washcloth.  5.  Apply the CHG  Soap to your body ONLY FROM THE NECK DOWN.   Do not use on face/ open                           Wound or open sores. Avoid contact with eyes, ears mouth and genitals (private parts).                       Wash face,  Genitals (private parts) with your normal soap.             6.  Wash thoroughly, paying special attention to the area where your surgery  will be performed.  7.  Thoroughly rinse your body with warm water from the neck down.  8.  DO NOT shower/wash with your normal soap after using and rinsing off  the CHG Soap.                9.  Pat yourself dry with a clean towel.            10.  Wear clean pajamas.            11.  Place clean sheets on your bed the night of your first shower and do not  sleep with pets. Day of Surgery : Do not apply any lotions/deodorants the morning of surgery.  Please wear clean clothes to the hospital/surgery center.  FAILURE TO FOLLOW THESE INSTRUCTIONS MAY RESULT IN THE CANCELLATION OF YOUR SURGERY PATIENT SIGNATURE_________________________________  NURSE SIGNATURE__________________________________  ________________________________________________________________________

## 2015-10-15 ENCOUNTER — Encounter (HOSPITAL_COMMUNITY)
Admission: RE | Admit: 2015-10-15 | Discharge: 2015-10-15 | Disposition: A | Discharge status: Home / Self Care | Payer: BLUE CROSS/BLUE SHIELD | Source: Ambulatory Visit | Attending: Interventional Radiology | Admitting: Interventional Radiology

## 2015-10-15 ENCOUNTER — Encounter (HOSPITAL_COMMUNITY): Payer: Self-pay

## 2015-10-15 DIAGNOSIS — Z79899 Other long term (current) drug therapy: Secondary | ICD-10-CM | POA: Diagnosis not present

## 2015-10-15 DIAGNOSIS — M1990 Unspecified osteoarthritis, unspecified site: Secondary | ICD-10-CM | POA: Diagnosis not present

## 2015-10-15 DIAGNOSIS — E1151 Type 2 diabetes mellitus with diabetic peripheral angiopathy without gangrene: Secondary | ICD-10-CM | POA: Diagnosis not present

## 2015-10-15 DIAGNOSIS — Z7984 Long term (current) use of oral hypoglycemic drugs: Secondary | ICD-10-CM | POA: Diagnosis not present

## 2015-10-15 DIAGNOSIS — Z85528 Personal history of other malignant neoplasm of kidney: Secondary | ICD-10-CM | POA: Diagnosis not present

## 2015-10-15 DIAGNOSIS — Z905 Acquired absence of kidney: Secondary | ICD-10-CM | POA: Diagnosis not present

## 2015-10-15 DIAGNOSIS — E669 Obesity, unspecified: Secondary | ICD-10-CM | POA: Diagnosis not present

## 2015-10-15 DIAGNOSIS — Z6841 Body Mass Index (BMI) 40.0 and over, adult: Secondary | ICD-10-CM | POA: Diagnosis not present

## 2015-10-15 DIAGNOSIS — Z87891 Personal history of nicotine dependence: Secondary | ICD-10-CM | POA: Diagnosis not present

## 2015-10-15 DIAGNOSIS — G4730 Sleep apnea, unspecified: Secondary | ICD-10-CM | POA: Diagnosis not present

## 2015-10-15 DIAGNOSIS — C642 Malignant neoplasm of left kidney, except renal pelvis: Secondary | ICD-10-CM | POA: Diagnosis not present

## 2015-10-15 HISTORY — DX: Malignant (primary) neoplasm, unspecified: C80.1

## 2015-10-15 HISTORY — DX: Anemia, unspecified: D64.9

## 2015-10-15 HISTORY — DX: Sleep apnea, unspecified: G47.30

## 2015-10-15 LAB — PROTIME-INR
INR: 1.08 (ref 0.00–1.49)
PROTHROMBIN TIME: 14.2 s (ref 11.6–15.2)

## 2015-10-15 LAB — BASIC METABOLIC PANEL
Anion gap: 7 (ref 5–15)
BUN: 16 mg/dL (ref 6–20)
CHLORIDE: 111 mmol/L (ref 101–111)
CO2: 25 mmol/L (ref 22–32)
CREATININE: 1.29 mg/dL — AB (ref 0.61–1.24)
Calcium: 9.1 mg/dL (ref 8.9–10.3)
GFR calc non Af Amer: 60 mL/min — ABNORMAL LOW (ref >60)
Glucose, Bld: 96 mg/dL (ref 65–99)
POTASSIUM: 4 mmol/L (ref 3.5–5.1)
SODIUM: 143 mmol/L (ref 135–145)

## 2015-10-15 LAB — APTT: APTT: 27 s (ref 24–37)

## 2015-10-15 NOTE — Progress Notes (Addendum)
10-15-15 - Called Dr. Delena Bali office ([place].) [phone]). to get CBC and HgbA1C lab results from 10-06-15.  Faxed signed authorization (fax [phone]) - attn. Cecille Rubin - to fax results to me.

## 2015-10-15 NOTE — Progress Notes (Signed)
08-31-15 - EKG - EPIC

## 2015-10-16 ENCOUNTER — Other Ambulatory Visit: Payer: Self-pay | Admitting: Radiology

## 2015-10-17 ENCOUNTER — Ambulatory Visit (HOSPITAL_COMMUNITY): Payer: BLUE CROSS/BLUE SHIELD | Admitting: Anesthesiology

## 2015-10-17 ENCOUNTER — Ambulatory Visit (HOSPITAL_COMMUNITY): Payer: BLUE CROSS/BLUE SHIELD

## 2015-10-17 ENCOUNTER — Ambulatory Visit (HOSPITAL_COMMUNITY)
Admission: RE | Admit: 2015-10-17 | Discharge: 2015-10-17 | Disposition: A | Discharge status: Home / Self Care | Payer: BLUE CROSS/BLUE SHIELD | Source: Ambulatory Visit | Attending: Interventional Radiology | Admitting: Interventional Radiology

## 2015-10-17 ENCOUNTER — Encounter (HOSPITAL_COMMUNITY): Payer: Self-pay | Admitting: Anesthesiology

## 2015-10-17 ENCOUNTER — Encounter (HOSPITAL_COMMUNITY)
Admission: RE | Disposition: A | Discharge status: Home / Self Care | Payer: Self-pay | Source: Ambulatory Visit | Attending: Interventional Radiology

## 2015-10-17 ENCOUNTER — Observation Stay (HOSPITAL_COMMUNITY)
Admission: RE | Admit: 2015-10-17 | Discharge: 2015-10-18 | Disposition: A | Discharge status: Home / Self Care | Payer: BLUE CROSS/BLUE SHIELD | Source: Ambulatory Visit | Attending: Interventional Radiology | Admitting: Interventional Radiology

## 2015-10-17 DIAGNOSIS — Z85528 Personal history of other malignant neoplasm of kidney: Secondary | ICD-10-CM | POA: Insufficient documentation

## 2015-10-17 DIAGNOSIS — Z79899 Other long term (current) drug therapy: Secondary | ICD-10-CM | POA: Insufficient documentation

## 2015-10-17 DIAGNOSIS — G4730 Sleep apnea, unspecified: Secondary | ICD-10-CM | POA: Insufficient documentation

## 2015-10-17 DIAGNOSIS — C649 Malignant neoplasm of unspecified kidney, except renal pelvis: Secondary | ICD-10-CM | POA: Diagnosis present

## 2015-10-17 DIAGNOSIS — Z87891 Personal history of nicotine dependence: Secondary | ICD-10-CM | POA: Insufficient documentation

## 2015-10-17 DIAGNOSIS — Z6841 Body Mass Index (BMI) 40.0 and over, adult: Secondary | ICD-10-CM | POA: Insufficient documentation

## 2015-10-17 DIAGNOSIS — M1990 Unspecified osteoarthritis, unspecified site: Secondary | ICD-10-CM | POA: Insufficient documentation

## 2015-10-17 DIAGNOSIS — E1151 Type 2 diabetes mellitus with diabetic peripheral angiopathy without gangrene: Secondary | ICD-10-CM | POA: Insufficient documentation

## 2015-10-17 DIAGNOSIS — Z7984 Long term (current) use of oral hypoglycemic drugs: Secondary | ICD-10-CM | POA: Insufficient documentation

## 2015-10-17 DIAGNOSIS — C642 Malignant neoplasm of left kidney, except renal pelvis: Principal | ICD-10-CM | POA: Insufficient documentation

## 2015-10-17 DIAGNOSIS — N2889 Other specified disorders of kidney and ureter: Secondary | ICD-10-CM | POA: Insufficient documentation

## 2015-10-17 DIAGNOSIS — D3002 Benign neoplasm of left kidney: Secondary | ICD-10-CM

## 2015-10-17 DIAGNOSIS — Z905 Acquired absence of kidney: Secondary | ICD-10-CM | POA: Insufficient documentation

## 2015-10-17 DIAGNOSIS — E669 Obesity, unspecified: Secondary | ICD-10-CM | POA: Insufficient documentation

## 2015-10-17 DIAGNOSIS — Z01818 Encounter for other preprocedural examination: Secondary | ICD-10-CM

## 2015-10-17 LAB — CBC
HCT: 40.7 % (ref 39.0–52.0)
Hemoglobin: 13.2 g/dL (ref 13.0–17.0)
MCH: 28.1 pg (ref 26.0–34.0)
MCHC: 32.4 g/dL (ref 30.0–36.0)
MCV: 86.8 fL (ref 78.0–100.0)
PLATELETS: 218 10*3/uL (ref 150–400)
RBC: 4.69 MIL/uL (ref 4.22–5.81)
RDW: 14.7 % (ref 11.5–15.5)
WBC: 8.1 10*3/uL (ref 4.0–10.5)

## 2015-10-17 LAB — GLUCOSE, CAPILLARY
GLUCOSE-CAPILLARY: 137 mg/dL — AB (ref 65–99)
GLUCOSE-CAPILLARY: 106 mg/dL — AB (ref 65–99)
Glucose-Capillary: 103 mg/dL — ABNORMAL HIGH (ref 65–99)
Glucose-Capillary: 101 mg/dL — ABNORMAL HIGH (ref 65–99)

## 2015-10-17 SURGERY — RADIOLOGY WITH ANESTHESIA
Anesthesia: General

## 2015-10-17 MED ORDER — SODIUM CHLORIDE 0.9 % IV SOLN: INTRAVENOUS | Status: DC

## 2015-10-17 MED ORDER — METOCLOPRAMIDE HCL 5 MG/ML IJ SOLN
INTRAMUSCULAR | Status: DC | PRN
  Administered 2015-10-17: 10 mg via INTRAVENOUS

## 2015-10-17 MED ORDER — TESTOSTERONE 50 MG/5GM (1%) TD GEL: 5.0000 g | Freq: Every day | TRANSDERMAL | Status: DC

## 2015-10-17 MED ORDER — IOHEXOL 350 MG/ML SOLN
100.0000 mL | Freq: Once | INTRAVENOUS | Status: AC | PRN
  Administered 2015-10-17: 100 mL via INTRAVENOUS

## 2015-10-17 MED ORDER — FENTANYL CITRATE (PF) 100 MCG/2ML IJ SOLN
INTRAMUSCULAR | Status: DC | PRN
  Administered 2015-10-17: 100 ug via INTRAVENOUS
  Administered 2015-10-17 (×2): 25 ug via INTRAVENOUS

## 2015-10-17 MED ORDER — METOPROLOL SUCCINATE ER 50 MG PO TB24
50.0000 mg | ORAL_TABLET | Freq: Every day | ORAL | Status: DC
  Administered 2015-10-18: 50 mg via ORAL
  Filled 2015-10-17: qty 1

## 2015-10-17 MED ORDER — SUGAMMADEX SODIUM 500 MG/5ML IV SOLN
INTRAVENOUS | Status: DC | PRN
  Administered 2015-10-17: 500 mg via INTRAVENOUS

## 2015-10-17 MED ORDER — MIDAZOLAM HCL 5 MG/5ML IJ SOLN
INTRAMUSCULAR | Status: DC | PRN
  Administered 2015-10-17: 2 mg via INTRAVENOUS

## 2015-10-17 MED ORDER — PROMETHAZINE HCL 25 MG/ML IJ SOLN: 6.2500 mg | INTRAMUSCULAR | Status: DC | PRN

## 2015-10-17 MED ORDER — DEXAMETHASONE SODIUM PHOSPHATE 10 MG/ML IJ SOLN
INTRAMUSCULAR | Status: DC | PRN
  Administered 2015-10-17: 5 mg via INTRAVENOUS

## 2015-10-17 MED ORDER — FENOFIBRATE 160 MG PO TABS
160.0000 mg | ORAL_TABLET | Freq: Every day | ORAL | Status: DC
  Administered 2015-10-17 – 2015-10-18 (×2): 160 mg via ORAL
  Filled 2015-10-17 (×3): qty 1

## 2015-10-17 MED ORDER — MIDAZOLAM HCL 2 MG/2ML IJ SOLN
INTRAMUSCULAR | Status: AC
  Filled 2015-10-17: qty 2

## 2015-10-17 MED ORDER — DOCUSATE SODIUM 100 MG PO CAPS
100.0000 mg | ORAL_CAPSULE | Freq: Two times a day (BID) | ORAL | Status: DC
  Filled 2015-10-17 (×3): qty 1

## 2015-10-17 MED ORDER — ROCURONIUM BROMIDE 100 MG/10ML IV SOLN
INTRAVENOUS | Status: DC | PRN
  Administered 2015-10-17 (×2): 10 mg via INTRAVENOUS
  Administered 2015-10-17: 50 mg via INTRAVENOUS
  Administered 2015-10-17: 10 mg via INTRAVENOUS

## 2015-10-17 MED ORDER — SENNOSIDES-DOCUSATE SODIUM 8.6-50 MG PO TABS: 1.0000 | ORAL_TABLET | Freq: Every evening | ORAL | Status: DC | PRN

## 2015-10-17 MED ORDER — ONDANSETRON HCL 4 MG/2ML IJ SOLN
INTRAMUSCULAR | Status: DC | PRN
  Administered 2015-10-17: 4 mg via INTRAVENOUS

## 2015-10-17 MED ORDER — PROPOFOL 10 MG/ML IV BOLUS
INTRAVENOUS | Status: DC | PRN
  Administered 2015-10-17: 150 mg via INTRAVENOUS

## 2015-10-17 MED ORDER — HYDROMORPHONE HCL 1 MG/ML IJ SOLN: 0.2500 mg | INTRAMUSCULAR | Status: DC | PRN

## 2015-10-17 MED ORDER — EPHEDRINE SULFATE 50 MG/ML IJ SOLN
INTRAMUSCULAR | Status: DC | PRN
  Administered 2015-10-17 (×2): 5 mg via INTRAVENOUS

## 2015-10-17 MED ORDER — CEFAZOLIN SODIUM-DEXTROSE 2-3 GM-% IV SOLR
2.0000 g | Freq: Once | INTRAVENOUS | Status: AC
  Administered 2015-10-17: 2 g via INTRAVENOUS
  Filled 2015-10-17 (×2): qty 50

## 2015-10-17 MED ORDER — ONDANSETRON HCL 4 MG/2ML IJ SOLN: 4.0000 mg | Freq: Four times a day (QID) | INTRAMUSCULAR | Status: DC | PRN

## 2015-10-17 MED ORDER — AMLODIPINE BESYLATE 5 MG PO TABS
5.0000 mg | ORAL_TABLET | Freq: Every day | ORAL | Status: DC
Start: 2015-10-18 — End: 2015-10-18
  Administered 2015-10-18: 5 mg via ORAL
  Filled 2015-10-17: qty 1

## 2015-10-17 MED ORDER — SUCCINYLCHOLINE CHLORIDE 20 MG/ML IJ SOLN
INTRAMUSCULAR | Status: DC | PRN
  Administered 2015-10-17: 140 mg via INTRAVENOUS

## 2015-10-17 MED ORDER — FENTANYL CITRATE (PF) 250 MCG/5ML IJ SOLN
INTRAMUSCULAR | Status: AC
  Filled 2015-10-17: qty 5

## 2015-10-17 MED ORDER — LINAGLIPTIN 5 MG PO TABS
5.0000 mg | ORAL_TABLET | Freq: Every day | ORAL | Status: DC
  Administered 2015-10-17 – 2015-10-18 (×2): 5 mg via ORAL
  Filled 2015-10-17 (×3): qty 1

## 2015-10-17 MED ORDER — SODIUM CHLORIDE 0.9 % IV SOLN
INTRAVENOUS | Status: AC
  Administered 2015-10-17: 14:00:00 via INTRAVENOUS

## 2015-10-17 MED ORDER — HYDROMORPHONE HCL 2 MG/ML IJ SOLN
INTRAMUSCULAR | Status: AC
  Filled 2015-10-17: qty 1

## 2015-10-17 MED ORDER — HYDROCODONE-ACETAMINOPHEN 5-325 MG PO TABS
1.0000 | ORAL_TABLET | ORAL | Status: DC | PRN
  Administered 2015-10-17 – 2015-10-18 (×2): 1 via ORAL
  Filled 2015-10-17 (×2): qty 1

## 2015-10-17 MED ORDER — LACTATED RINGERS IV SOLN
INTRAVENOUS | Status: DC
  Administered 2015-10-17 (×3): via INTRAVENOUS

## 2015-10-17 MED ORDER — LIDOCAINE HCL (CARDIAC) 20 MG/ML IV SOLN
INTRAVENOUS | Status: DC | PRN
  Administered 2015-10-17: 50 mg via INTRAVENOUS

## 2015-10-17 NOTE — Transfer of Care (Signed)
Immediate Anesthesia Transfer of Care Note  Patient: Lawrence [name]  Procedure(s) Performed: Procedure(s): CT THERMAL ABLALTION (N/A)  Patient Location: PACU  Anesthesia Type:General  Level of Consciousness:  sedated, patient cooperative and responds to stimulation  Airway & Oxygen Therapy:Patient Spontanous Breathing and Patient connected to face mask oxgen  Post-op Assessment:  Report given to PACU RN and Post -op Vital signs reviewed and stable  Post vital signs:  Reviewed and stable  Last Vitals:  Filed Vitals:   10/17/15 0701  BP: 158/74  Pulse: 63  Temp: 36.9 C  Resp: 16    Complications: No apparent anesthesia complications

## 2015-10-17 NOTE — Progress Notes (Signed)
Patient ID: Lawrence [name], male   DOB: 07-Sep-1956, 59 y.o.   MRN: [id number]    Referring Physician(s): Herrick,B  Chief Complaint:  Left renal neoplasm  Subjective: Lawrence [name] is a 59 year-old white male who was recently seen in consultation by Dr. Laurence Ferrari for treatment options regarding an enlarging left renal neoplasm. 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 Postop the patient did suffer rhabdomyolysis thought secondary to prolonged anesthesia time. He is currently stable. Recent imaging has revealed a lesion in the posterior aspect of the lower pole of the left kidney measuring approximately 2.3 x 2.3 cm which is slightly enlarged compared to prior MRI dated 01/11/14. The lesion is suspicious for a synchronous second renal cell carcinoma . Following recent consultation the patient was deemed an appropriate candidate for CT-guided thermal ablation and possible biopsy of the left renal neoplasm. He presents today for the above procedure. He currently denies fever, chills, headache, chest pain, dyspnea, cough, abdominal pain, nausea, vomiting or abnormal bleeding. He does have some chronic back pain and bilateral lower extremity swelling.   Allergies: Review of patient's allergies indicates no known allergies.  Medications: Prior to Admission medications   Medication Sig Start Date End Date Taking? Authorizing Provider  amLODipine (NORVASC) 5 MG tablet Take 5 mg by mouth daily.   Yes Historical Provider, MD  Blood Glucose Monitoring Suppl (ONE TOUCH ULTRA 2) W/DEVICE KIT  08/26/12  Yes Historical Provider, MD  fenofibrate 160 MG tablet Take 160 mg by mouth daily.  08/10/12  Yes Historical Provider, MD  metoprolol succinate (TOPROL-XL) 50 MG 24 hr tablet Take 50 mg by mouth daily. Take with or immediately following a meal.   Yes Historical Provider, MD  Misc Natural Products (OSTEO  BI-FLEX TRIPLE STRENGTH) TABS Take 2 tablets by mouth daily.   Yes Historical Provider, MD  omega-3 acid ethyl esters (LOVAZA) 1 G capsule Take 1 g by mouth daily.   Yes Historical Provider, MD  Prenatal w/o A Vit-Fe Fum-FA (PRENATAL-U) 106.5-1 MG CAPS  07/25/15  Yes Historical Provider, MD  sitaGLIPtin (JANUVIA) 100 MG tablet Take 100 mg by mouth daily.   Yes Historical Provider, MD  testosterone (ANDROGEL) 50 MG/5GM (1%) GEL Place 0.081 g onto the skin daily. 2 pumps to each shoulder daily (20.82m per pump=873mtotal/day)   Yes Historical Provider, MD  vitamin C (ASCORBIC ACID) 500 MG tablet Take 500 mg by mouth daily.   Yes Historical Provider, MD     Vital Signs: BP 158/74 mmHg  Pulse 63  Temp(Src) 98.4 F (36.9 C) (Oral)  Resp 16  Ht 6' (1.829 m)  Wt 316 lb (143.337 kg)  BMI 42.85 kg/m2  SpO2 98%  Physical Exam patient is awake, alert. Chest is clear to auscultation bilaterally. Heart with regular rate and rhythm. Abdomen obese, positive bowel sounds, nontender. Extremities with 2+ bilateral extremity edema.  Imaging: Dg Chest 1 View  10/17/2015  CLINICAL DATA:  Preoperative examination (CT tissue ablation) EXAM: CHEST 1 VIEW COMPARISON:  None. FINDINGS: Borderline enlarged cardiac silhouette and mediastinal contours with mild tortuosity of the thoracic aorta. Mild diffuse slightly nodular thickening of the pulmonary interstitium. No focal airspace opacities. No pleural effusion or pneumothorax. No evidence of edema. No acute osseus abnormalities. IMPRESSION: Borderline cardiomegaly and bronchitic change without acute cardiopulmonary disease. Electronically Signed   By: JoSandi Mariscal.D.   On: 10/17/2015 07:29    Labs:  CBC:  Recent Labs  09/09/15 0859 09/10/15 0451 09/11/15 0432 10/17/15 0720  WBC 19.6* 17.6* 13.4* 8.1  HGB 13.1 12.9* 13.4 13.2  HCT 38.3* 38.3* 39.7 40.7  PLT 165 175 194 218    COAGS:  Recent Labs  10/15/15 1500  INR 1.08  APTT 27     BMP:  Recent Labs  09/11/15 1830 09/12/15 0440 09/13/15 0905 10/15/15 1500  NA 134* 134* 134* 143  K 3.7 3.4* 3.5 4.0  CL 92* 94* 97* 111  CO2 34* 29 28 25   GLUCOSE 123* 110* 197* 96  BUN 34* 33* 37* 16  CALCIUM 8.2* 8.3* 8.3* 9.1  CREATININE 2.26* 2.00* 1.88* 1.29*  GFRNONAA 30* 35* 38* 60*  GFRAA 35* 41* 44* >60    LIVER FUNCTION TESTS:  Recent Labs  08/31/15 1510 09/08/15 1545 09/11/15 0432 09/12/15 0440  BILITOT 0.8  --   --   --   AST 31  --   --   --   ALT 42  --   --   --   ALKPHOS 46  --   --   --   PROT 7.3  --   --   --   ALBUMIN 4.1 3.5 2.5* 2.8*    Assessment and Plan: Patient with past medical history significant for hypertension, diabetes, chronic kidney disease, sleep apnea, clear cell carcinoma of the right kidney with prior partial nephrectomy 09/07/15 and now with enlarging left renal neoplasm. Patient recently seen in consultation by Dr. Laurence Ferrari and deemed an appropriate candidate for CT-guided thermal ablation /possible biopsy of the left renal neoplasm. Details/risks of the above procedure, including not limited to, internal bleeding, infection, anesthesia-related complications, worsening renal function, injury to adjacent organs, discussed with patient and wife with their understanding and consent. Postprocedure the patient will be admitted for overnight observation.   Signed: D. Rowe Robert 10/17/2015, 8:25 AM   I spent a total of 30 minutes at the the patient's bedside AND on the patient's hospital floor or unit, greater than 50% of which was counseling/coordinating care for CT-guided thermal ablation/possible biopsy of left renal neoplasm

## 2015-10-17 NOTE — Progress Notes (Signed)
Pt has home mask and tubing. Pt is able to place on himself when ready for bed. Pt and family encouraged to call RT if needing any assistance.

## 2015-10-17 NOTE — Anesthesia Procedure Notes (Signed)
Procedure Name: Intubation Date/Time: 10/17/2015 9:09 AM Performed by: Carrine Kroboth, Virgel Gess Pre-anesthesia Checklist: Patient identified, Emergency Drugs available, Suction available, Patient being monitored and Timeout performed Patient Re-evaluated:Patient Re-evaluated prior to inductionOxygen Delivery Method: Circle system utilized Preoxygenation: Pre-oxygenation with 100% oxygen Intubation Type: IV induction Ventilation: Oral airway inserted - appropriate to patient size Laryngoscope Size: Mac and 4 Grade View: Grade II Tube type: Oral Tube size: 7.5 mm Number of attempts: 1 Airway Equipment and Method: Stylet Placement Confirmation: ETT inserted through vocal cords under direct vision,  positive ETCO2,  CO2 detector and breath sounds checked- equal and bilateral Secured at: 23 cm Tube secured with: Tape Dental Injury: Teeth and Oropharynx as per pre-operative assessment

## 2015-10-17 NOTE — Anesthesia Preprocedure Evaluation (Addendum)
Anesthesia Evaluation  Patient identified by MRN, date of birth, ID band  Reviewed: Allergy & Precautions, NPO status , Patient's Chart, lab work & pertinent test results  Airway Mallampati: III  TM Distance: >3 FB Neck ROM: Full    Dental  (+) Teeth Intact, Dental Advisory Given   Pulmonary sleep apnea , former smoker,    breath sounds clear to auscultation       Cardiovascular hypertension, + Peripheral Vascular Disease   Rhythm:Regular Rate:Normal     Neuro/Psych    GI/Hepatic negative GI ROS,   Endo/Other  diabetes  Renal/GU Renal disease     Musculoskeletal  (+) Arthritis ,   Abdominal (+) + obese,   Peds  Hematology   Anesthesia Other Findings   Reproductive/Obstetrics                            Anesthesia Physical Anesthesia Plan  ASA: III  Anesthesia Plan: General   Post-op Pain Management:    Induction: Intravenous  Airway Management Planned: Oral ETT and Video Laryngoscope Planned  Additional Equipment:   Intra-op Plan:   Post-operative Plan: Extubation in OR  Informed Consent: I have reviewed the patients History and Physical, chart, labs and discussed the procedure including the risks, benefits and alternatives for the proposed anesthesia with the patient or authorized representative who has indicated his/her understanding and acceptance.   Dental advisory given  Plan Discussed with: CRNA and Surgeon  Anesthesia Plan Comments:        Anesthesia Quick Evaluation

## 2015-10-17 NOTE — Anesthesia Postprocedure Evaluation (Signed)
Anesthesia Post Note  Patient: Lawrence [name]  Procedure(s) Performed: Procedure(s) (LRB): CT THERMAL ABLALTION (N/A)  Patient location during evaluation: PACU Anesthesia Type: General Level of consciousness: awake and alert Pain management: pain level controlled Vital Signs Assessment: post-procedure vital signs reviewed and stable Respiratory status: spontaneous breathing, nonlabored ventilation, respiratory function stable and patient connected to nasal cannula oxygen Cardiovascular status: blood pressure returned to baseline and stable Postop Assessment: no signs of nausea or vomiting Anesthetic complications: no    Last Vitals:  Filed Vitals:   10/17/15 1200 10/17/15 1215  BP: 129/70 144/66  Pulse: 65 70  Temp: 36.9 C 36.6 C  Resp: 10 14    Last Pain:  Filed Vitals:   10/17/15 1217  PainSc: 3                  Chaye Misch,JAMES TERRILL

## 2015-10-17 NOTE — Progress Notes (Signed)
Patient ID: Lawrence [name], male   DOB: Mar 03, 1956, 59 y.o.   MRN: [id number]          Subjective: Pt doing fairly well; has some mild left flank soreness; denies sig abd pain,N/V or breathing difficulties; has eaten; yellow urine in Foley bag   Allergies: Review of patient's allergies indicates no known allergies.  Medications: Prior to Admission medications   Medication Sig Start Date End Date Taking? Authorizing Provider  amLODipine (NORVASC) 5 MG tablet Take 5 mg by mouth daily.   Yes Historical Provider, MD  Blood Glucose Monitoring Suppl (ONE TOUCH ULTRA 2) W/DEVICE KIT  08/26/12  Yes Historical Provider, MD  fenofibrate 160 MG tablet Take 160 mg by mouth daily.  08/10/12  Yes Historical Provider, MD  metoprolol succinate (TOPROL-XL) 50 MG 24 hr tablet Take 50 mg by mouth daily. Take with or immediately following a meal.   Yes Historical Provider, MD  Misc Natural Products (OSTEO BI-FLEX TRIPLE STRENGTH) TABS Take 2 tablets by mouth daily.   Yes Historical Provider, MD  omega-3 acid ethyl esters (LOVAZA) 1 G capsule Take 1 g by mouth daily.   Yes Historical Provider, MD  Prenatal w/o A Vit-Fe Fum-FA (PRENATAL-U) 106.5-1 MG CAPS  07/25/15  Yes Historical Provider, MD  sitaGLIPtin (JANUVIA) 100 MG tablet Take 100 mg by mouth daily.   Yes Historical Provider, MD  testosterone (ANDROGEL) 50 MG/5GM (1%) GEL Place 0.081 g onto the skin daily. 2 pumps to each shoulder daily (20.24m per pump=858mtotal/day)   Yes Historical Provider, MD  vitamin C (ASCORBIC ACID) 500 MG tablet Take 500 mg by mouth daily.   Yes Historical Provider, MD     Vital Signs: BP 136/69 mmHg  Pulse 75  Temp(Src) 97.9 F (36.6 C) (Oral)  Resp 18  Ht 6' (1.829 m)  Wt 316 lb (143.337 kg)  BMI 42.85 kg/m2  SpO2 99%  Physical Exam awake/alert; puncture site left flank clean and dry,NT, no hematoma  Imaging: Dg Chest 1 View  10/17/2015  CLINICAL DATA:  Preoperative examination (CT tissue ablation) EXAM:  CHEST 1 VIEW COMPARISON:  None. FINDINGS: Borderline enlarged cardiac silhouette and mediastinal contours with mild tortuosity of the thoracic aorta. Mild diffuse slightly nodular thickening of the pulmonary interstitium. No focal airspace opacities. No pleural effusion or pneumothorax. No evidence of edema. No acute osseus abnormalities. IMPRESSION: Borderline cardiomegaly and bronchitic change without acute cardiopulmonary disease. Electronically Signed   By: JoSandi Mariscal.D.   On: 10/17/2015 07:29   Ct Guide Tissue Ablation  10/17/2015  CLINICAL DATA:  [age]ear old male with a history of bilateral renal neoplasms larger on the right than the left. 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 Smaller left renal lesion is solid and enhancing concerning for a synchronous primary renal cell carcinoma. Patient presents today for percutaneous thermal ablation of the same. EXAM: CT GUIDED ABLATION Date: 10/17/2015 PROCEDURE: 1. Percutaneous thermal ablation of left renal neoplasm under CT guidance Interventional Radiologist:  HeCriselda PeachesMD ANESTHESIA/SEDATION: General anesthesia provided by anesthesiology service. MEDICATIONS: 2 g Ancef administered intravenously FLUOROSCOPY TIME:  None CONTRAST:  100 omni 350 TECHNIQUE: Informed consent was obtained from the patient following explanation of the procedure, risks, benefits and alternatives. The patient understands, agrees and consents for the procedure. All questions were addressed. A time out was performed. Maximal barrier sterile technique utilized including caps, mask, sterile gowns, sterile gloves, large sterile  drape, hand hygiene, and Betadine skin prep. A planning axial CT scan was performed. The small exophytic lesion from the lower pole left kidney was successfully identified. A small dermatotomy was made. Using intermittent CT guidance, a 15  cm NeuWave PR microwave antenna was carefully advanced into the center mass of the lesion. Thermal ablation was then performed at 65 watts for 10 minutes. The ablation zone was monitored intermittently was CT imaging. Following ablation the antenna was removed and discarded. Post ablation axial CT imaging with contrast in arterial, venous and delayed phases was performed. The lesion has significantly decreased in size secondary to desiccation. There is a nice margin of ablated kidney. No evidence of hemorrhage or other complicating feature. The patient was extubated and taken to the PACU. COMPLICATIONS: None IMPRESSION: Technically successful percutaneous thermal ablation of left lower pole renal neoplasm. Signed, Criselda Peaches, MD [facility] Radiology Electronically Signed   By: Jacqulynn Cadet M.D.   On: 10/17/2015 11:25    Labs:  CBC:  Recent Labs  09/09/15 0859 09/10/15 0451 09/11/15 0432 10/17/15 0720  WBC 19.6* 17.6* 13.4* 8.1  HGB 13.1 12.9* 13.4 13.2  HCT 38.3* 38.3* 39.7 40.7  PLT 165 175 194 218    COAGS:  Recent Labs  10/15/15 1500  INR 1.08  APTT 27    BMP:  Recent Labs  09/11/15 1830 09/12/15 0440 09/13/15 0905 10/15/15 1500  NA 134* 134* 134* 143  K 3.7 3.4* 3.5 4.0  CL 92* 94* 97* 111  CO2 34* _0 GLUCOSE 123* 110* 197* 96  BUN 34* 33* 37* 16  CALCIUM 8.2* 8.3* 8.3* 9.1  CREATININE 2.26* 2.00* 1.88* 1.29*  GFRNONAA 30* 35* 38* 60*  GFRAA 35* 41* 44* >60    LIVER FUNCTION TESTS:  Recent Labs  08/31/15 1510 09/08/15 1545 09/11/15 0432 09/12/15 0440  BILITOT 0.8  --   --   --   AST 31  --   --   --   ALT 42  --   --   --   ALKPHOS 46  --   --   --   PROT 7.3  --   --   --   ALBUMIN 4.1 3.5 2.5* 2.8*    Assessment and Plan:  S/p left renal neoplasm thermal ablation 12/14; for overnight obs; d/c foley later this evening; check am labs; f/u in IR clinic at discretion of Dr.  Laurence Ferrari  Signed: D. Rowe Robert 10/17/2015, 4:29 PM   I spent a total of 15 minutes at the the patient's bedside AND on the patient's hospital floor or unit, greater than 50% of which was counseling/coordinating care for left renal neoplasm thermal ablation

## 2015-10-17 NOTE — Procedures (Signed)
Interventional Radiology Procedure Note  Procedure: CT guided thermal ablation left renal neoplasm.    Complications: None  Estimated Blood Loss: 0  Recommendations: - Admit for obs - ADAT - Foley out after 6 hrs if no hematuria  Signed,  Criselda Peaches, MD

## 2015-10-18 ENCOUNTER — Other Ambulatory Visit: Payer: Self-pay | Admitting: Radiology

## 2015-10-18 DIAGNOSIS — N2889 Other specified disorders of kidney and ureter: Secondary | ICD-10-CM

## 2015-10-18 DIAGNOSIS — C642 Malignant neoplasm of left kidney, except renal pelvis: Secondary | ICD-10-CM | POA: Diagnosis not present

## 2015-10-18 LAB — BASIC METABOLIC PANEL
Anion gap: 8 (ref 5–15)
BUN: 14 mg/dL (ref 6–20)
CALCIUM: 9 mg/dL (ref 8.9–10.3)
CO2: 26 mmol/L (ref 22–32)
CREATININE: 1.2 mg/dL (ref 0.61–1.24)
Chloride: 107 mmol/L (ref 101–111)
GFR calc non Af Amer: >60 mL/min (ref >60)
Glucose, Bld: 113 mg/dL — ABNORMAL HIGH (ref 65–99)
Potassium: 3.7 mmol/L (ref 3.5–5.1)
SODIUM: 141 mmol/L (ref 135–145)

## 2015-10-18 LAB — CBC
HCT: 38.9 % — ABNORMAL LOW (ref 39.0–52.0)
Hemoglobin: 12.5 g/dL — ABNORMAL LOW (ref 13.0–17.0)
MCH: 28.3 pg (ref 26.0–34.0)
MCHC: 32.1 g/dL (ref 30.0–36.0)
MCV: 88.2 fL (ref 78.0–100.0)
PLATELETS: 214 10*3/uL (ref 150–400)
RBC: 4.41 MIL/uL (ref 4.22–5.81)
RDW: 14.6 % (ref 11.5–15.5)
WBC: 11.1 10*3/uL — ABNORMAL HIGH (ref 4.0–10.5)

## 2015-10-18 LAB — GLUCOSE, CAPILLARY: Glucose-Capillary: 99 mg/dL (ref 65–99)

## 2015-10-18 NOTE — Discharge Summary (Signed)
Patient ID: Lawrence [name] MRN: [id number] DOB/AGE: 1956/06/06 59 y.o.  Admit date: 10/17/2015 Discharge date: 10/18/2015  Admission Diagnoses: Left renal neoplasm  Discharge Diagnoses: Left renal neoplasm, status post CT-guided thermal ablation on 10/17/15 Active Problems:   Left renal mass   Renal cell cancer Lawrence [name] - [place])  Past Medical History  Diagnosis Date  . Hypertension   . Diabetes mellitus without complication (Lawrence [name])   . Chronic kidney disease     bil renal mass  . Arthritis     osteoarthritis  . Sleep apnea     uses c-pap machine  . Anemia   . Cancer Lawrence [name])     kidney   Past Surgical History  Procedure Laterality Date  . Left gsv laser ablation    . Tonsillectomy    . Robotic assited partial nephrectomy Right 09/07/2015    Procedure: RIGHT ROBOTIC ASSITED PARTIAL NEPHRECTOMY;  Surgeon: Ardis Hughs, MD;  Location: [facility];  Service: Urology;  Laterality: Right;      Discharged Condition: good  [name] Course: Mr. Lawrence [name] is a 59 year-old white male who was recently seen in consultation by Dr. Laurence [name] for treatment options regarding an enlarging left renal neoplasm. 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 Postop the patient did suffer rhabdomyolysis thought secondary to prolonged anesthesia time. He is currently stable. Recent imaging has revealed a lesion in the posterior aspect of the lower pole of the left kidney measuring approximately 2.3 x 2.3 cm which is slightly enlarged compared to prior MRI dated 01/11/14. The lesion is suspicious for a synchronous second renal cell carcinoma . Following recent consultation the patient was deemed an appropriate candidate for CT-guided thermal ablation and possible biopsy of the left renal neoplasm. On 10/17/15 the patient underwent CT-guided thermal ablation of the left renal neoplasm by Dr. Laurence [name] via general  anesthesia. The procedure was performed without immediate complications and the patient was admitted for overnight observation. Patient did well overnight with the exception of some mild left flank discomfort. On the morning of discharge the patient was stable. He was able to tolerate his diet, ambulate and void without difficulty. There was only minimal discomfort in the left flank region to palpation. Urine was clear and yellow. Follow-up laboratory values revealed creatinine of 1.20, potassium 3.7, WBC 11.1, hemoglobin 12.5, platelets 214K. The patient was seen by Dr. Kathlene [name] and deemed stable for discharge at this time. He will follow-up with Dr. Laurence [name] in the interventional radiology clinic in approximately 1 month with BMP. He will remain on his current home medications and continue follow up with Dr. Louis [name] as needed. He was told to contact our service in the interim with any additional questions or concerns.  Consults: none  Significant Diagnostic Studies:  Results for orders placed or performed during the [name] encounter of 10/17/15  Glucose, capillary  Result Value Ref Range   Glucose-Capillary 103 (H) 65 - 99 mg/dL  CBC  Result Value Ref Range   WBC 8.1 4.0 - 10.5 K/uL   RBC 4.69 4.22 - 5.81 MIL/uL   Hemoglobin 13.2 13.0 - 17.0 g/dL   HCT 40.7 39.0 - 52.0 %   MCV 86.8 78.0 - 100.0 fL   MCH 28.1 26.0 - 34.0 pg   MCHC 32.4 30.0 - 36.0 g/dL   RDW 14.7 11.5 - 15.5 %   Platelets 218 150 - 400 K/uL  Glucose, capillary  Result Value  Ref Range   Glucose-Capillary 101 (H) 65 - 99 mg/dL  CBC  Result Value Ref Range   WBC 11.1 (H) 4.0 - 10.5 K/uL   RBC 4.41 4.22 - 5.81 MIL/uL   Hemoglobin 12.5 (L) 13.0 - 17.0 g/dL   HCT 38.9 (L) 39.0 - 52.0 %   MCV 88.2 78.0 - 100.0 fL   MCH 28.3 26.0 - 34.0 pg   MCHC 32.1 30.0 - 36.0 g/dL   RDW 14.6 11.5 - 15.5 %   Platelets 214 150 - 400 K/uL  Basic metabolic panel  Result Value Ref Range   Sodium 141 135 - 145 mmol/L   Potassium 3.7  3.5 - 5.1 mmol/L   Chloride 107 101 - 111 mmol/L   CO2 26 22 - 32 mmol/L   Glucose, Bld 113 (H) 65 - 99 mg/dL   BUN 14 6 - 20 mg/dL   Creatinine, Ser 1.20 0.61 - 1.24 mg/dL   Calcium 9.0 8.9 - 10.3 mg/dL   GFR calc non Af Amer >60 >60 mL/min   GFR calc Af Amer >60 >60 mL/min   Anion gap 8 5 - 15  Glucose, capillary  Result Value Ref Range   Glucose-Capillary 137 (H) 65 - 99 mg/dL  Glucose, capillary  Result Value Ref Range   Glucose-Capillary 106 (H) 65 - 99 mg/dL  Glucose, capillary  Result Value Ref Range   Glucose-Capillary 99 65 - 99 mg/dL     Treatments: CT-guided thermal ablation of left lower pole renal neoplasm via general anesthesia on 10/17/15  Discharge Exam: Blood pressure 136/77, pulse 64, temperature 98 F (36.7 C), temperature source Oral, resp. rate 18, height 6' (1.829 m), weight 316 lb (143.337 kg), SpO2 97 %. Patient awake, alert. Chest clear to auscultation bilaterally. Heart with some occasional ectopy, normal rate. Abdomen obese, positive bowel sounds, soft, nontender. Puncture site left flank clean, dry, minimal tenderness to palpation, no discrete hematoma. Extremities with full range of motion, 2+ bilateral lower extremity edema.  Disposition: home  Discharge Instructions    Call MD for:  difficulty breathing, headache or visual disturbances    Complete by:  As directed      Call MD for:  extreme fatigue    Complete by:  As directed      Call MD for:  hives    Complete by:  As directed      Call MD for:  persistant dizziness or light-headedness    Complete by:  As directed      Call MD for:  persistant nausea and vomiting    Complete by:  As directed      Call MD for:  redness, tenderness, or signs of infection (pain, swelling, redness, odor or green/yellow discharge around incision site)    Complete by:  As directed      Call MD for:  severe uncontrolled pain    Complete by:  As directed      Call MD for:  temperature >100.4    Complete by:  As  directed      Diet - low sodium heart healthy    Complete by:  As directed      Discharge wound care:    Complete by:  As directed   May keep Band-Aid over puncture site left flank for next 2-3 days. May wash site with soap and water.     Driving Restrictions    Complete by:  As directed   No driving for next 24 hours  Increase activity slowly    Complete by:  As directed      Lifting restrictions    Complete by:  As directed   No heavy lifting for the next 3-4 days     May shower / Bathe    Complete by:  As directed      May walk up steps    Complete by:  As directed             Medication List    TAKE these medications        amLODipine 5 MG tablet  Commonly known as:  NORVASC  Take 5 mg by mouth daily.     fenofibrate 160 MG tablet  Take 160 mg by mouth daily.     metoprolol succinate 50 MG 24 hr tablet  Commonly known as:  TOPROL-XL  Take 50 mg by mouth daily. Take with or immediately following a meal.     omega-3 acid ethyl esters 1 G capsule  Commonly known as:  LOVAZA  Take 1 g by mouth daily.     ONE TOUCH ULTRA 2 W/DEVICE Kit     OSTEO BI-FLEX TRIPLE STRENGTH Tabs  Take 2 tablets by mouth daily.     PRENATAL-U 106.5-1 MG Caps     sitaGLIPtin 100 MG tablet  Commonly known as:  JANUVIA  Take 100 mg by mouth daily.     testosterone 50 MG/5GM (1%) Gel  Commonly known as:  ANDROGEL  Place 0.081 g onto the skin daily. 2 pumps to each shoulder daily (20.61m per pump=841mtotal/day)     vitamin C 500 MG tablet  Commonly known as:  ASCORBIC ACID  Take 500 mg by mouth daily.           Follow-up Information    Follow up with HEArdis HughsMD.   Specialty:  Urology   Why:  Follow-up with Dr. HeLouis Meckels scheduled   Contact information:   [id number] [phone]     Follow up with MCJacqulynn CadetMD.   Specialties:  Interventional Radiology, Radiology   Why:  Radiology will call you with follow up  appointment in interventional radiology clinic in approximately 1 month.; Call [phone] any questions.   Contact information:   [address] [place] [phone]      Signed: D. KeRowe Robert2/15/2016, 9:48 AM   I have spent less than 30 minutes discharging [employer]

## 2015-10-18 NOTE — Discharge Instructions (Signed)
Please contact radiology at [phone] or [phone] with any new change in clinical status, such as  fever, chest pain, abdominal/back pain, blood in urine, burning with urination or lightheadedness.

## 2015-10-19 ENCOUNTER — Inpatient Hospital Stay (HOSPITAL_COMMUNITY): Admission: RE | Admit: 2015-10-19 | Payer: BLUE CROSS/BLUE SHIELD | Source: Ambulatory Visit | Admitting: Urology

## 2015-10-19 ENCOUNTER — Encounter (HOSPITAL_COMMUNITY): Admission: RE | Payer: Self-pay | Source: Ambulatory Visit

## 2015-10-19 SURGERY — ROBOTIC ASSITED PARTIAL NEPHRECTOMY
Anesthesia: General | Laterality: Left

## 2015-10-22 ENCOUNTER — Other Ambulatory Visit: Payer: Self-pay | Admitting: *Deleted

## 2015-10-22 DIAGNOSIS — N2889 Other specified disorders of kidney and ureter: Secondary | ICD-10-CM

## 2015-11-23 LAB — BASIC METABOLIC PANEL WITH GFR
BUN: 19 mg/dL (ref 7–25)
CALCIUM: 9.3 mg/dL (ref 8.6–10.3)
CHLORIDE: 107 mmol/L (ref 98–110)
CO2: 28 mmol/L (ref 20–31)
Creat: 1.37 mg/dL — ABNORMAL HIGH (ref 0.70–1.33)
GFR, EST NON AFRICAN AMERICAN: 57 mL/min — AB (ref >=60)
GFR, Est African American: 66 mL/min (ref >=60)
GLUCOSE: 117 mg/dL — AB (ref 65–99)
POTASSIUM: 4 mmol/L (ref 3.5–5.3)
SODIUM: 141 mmol/L (ref 135–146)

## 2015-11-27 ENCOUNTER — Ambulatory Visit
Admission: RE | Admit: 2015-11-27 | Discharge: 2015-11-27 | Disposition: A | Discharge status: Home / Self Care | Payer: BLUE CROSS/BLUE SHIELD | Source: Ambulatory Visit | Attending: Radiology | Admitting: Radiology

## 2015-11-27 DIAGNOSIS — N2889 Other specified disorders of kidney and ureter: Secondary | ICD-10-CM

## 2015-11-27 NOTE — Progress Notes (Signed)
Chief Complaint: Patient was seen in consultation today for  Chief Complaint  Patient presents with  . Follow-up    5 wk follow up Thermal Ablation of Left Renal Mass   at the request of Allred,Darrell K  Referring Physician(s): Allred,Darrell K  History of Present Illness: Lawrence [name] is a 60 y.o. male a known history of clear cell renal carcinoma of the right kidney and is recently post robotic-assisted laparoscopic partial nephrectomy performed by Dr. Louis Meckel on 09/07/2015. Surgical margins were negative. His postoperative course was complicated by rhabdomyolysis thought secondary to prolonged anesthesia time.   He had a second smaller enhancing lesion exophytic from the posterior lower pole of the left kidney. Due to his difficulties with anesthesia during his partial nephrectomy, he elected to undergo percutaneous thermal ablation on 10/17/2015. He presents today for his 4 week follow-up evaluation.  Mr. Easom is doing very well. He denies flank pain, hematuria, fever, chills or other symptoms. His recovery was uneventful. He had no significant pain. The percutaneous access site has healed completely. He continues to recover from his prior rhabdomyolysis. He still requires a cane intermittently.  Past Medical History  Diagnosis Date  . Hypertension   . Diabetes mellitus without complication ([place])   . Chronic kidney disease     bil renal mass  . Arthritis     osteoarthritis  . Sleep apnea     uses c-pap machine  . Anemia   . Cancer [facility])     kidney    Past Surgical History  Procedure Laterality Date  . Left gsv laser ablation    . Tonsillectomy    . Robotic assited partial nephrectomy Right 09/07/2015    Procedure: RIGHT ROBOTIC ASSITED PARTIAL NEPHRECTOMY;  Surgeon: Ardis Hughs, MD;  Location: [facility];  Service: Urology;  Laterality: Right;    Allergies: Review of patient's allergies indicates no known allergies.  Medications: Prior to Admission  medications   Medication Sig Start Date End Date Taking? Authorizing Provider  amLODipine (NORVASC) 5 MG tablet Take 5 mg by mouth daily.   Yes Historical Provider, MD  Blood Glucose Monitoring Suppl (ONE TOUCH ULTRA 2) W/DEVICE KIT  08/26/12  Yes Historical Provider, MD  fenofibrate 160 MG tablet Take 160 mg by mouth daily.  08/10/12  Yes Historical Provider, MD  metoprolol succinate (TOPROL-XL) 50 MG 24 hr tablet Take 50 mg by mouth daily. Take with or immediately following a meal.   Yes Historical Provider, MD  Misc Natural Products (OSTEO BI-FLEX TRIPLE STRENGTH) TABS Take 2 tablets by mouth daily.   Yes Historical Provider, MD  omega-3 acid ethyl esters (LOVAZA) 1 G capsule Take 1 g by mouth daily. Reported on 11/27/2015   Yes Historical Provider, MD  Prenatal w/o A Vit-Fe Fum-FA (PRENATAL-U) 106.5-1 MG CAPS  07/25/15  Yes Historical Provider, MD  sitaGLIPtin (JANUVIA) 100 MG tablet Take 100 mg by mouth daily.   Yes Historical Provider, MD  testosterone (ANDROGEL) 50 MG/5GM (1%) GEL Place 0.081 g onto the skin daily. 2 pumps to each shoulder daily (20.8m per pump=89mtotal/day)   Yes Historical Provider, MD  vitamin C (ASCORBIC ACID) 500 MG tablet Take 500 mg by mouth daily.   Yes Historical Provider, MD     Family History  Problem Relation Age of Onset  . Cancer Father   . Hyperlipidemia Brother     Social History   Social History  . Marital Status: Married    Spouse Name: N/A  .  Number of Children: N/A  . Years of Education: N/A   Social History Main Topics  . Smoking status: Former Smoker    Quit date: 12/04/2004  . Smokeless tobacco: Never Used  . Alcohol Use: Yes     Comment: "once in a blue moon" social drinker  . Drug Use: No  . Sexual Activity: Not on file   Other Topics Concern  . Not on file   Social History Narrative     Review of Systems: A 12 point ROS discussed and pertinent positives are indicated in the HPI above.  All other systems are  negative.  Review of Systems  Vital Signs: BP 151/72 mmHg  Pulse 58  Temp(Src) 98.7 F (37.1 C) (Oral)  Resp 14  SpO2 97%  Physical Exam  Constitutional: He is oriented to person, place, and time. He appears well-developed and well-nourished. No distress.  HENT:  Head: Normocephalic and atraumatic.  Eyes: No scleral icterus.  Cardiovascular: Normal rate and regular rhythm.   Pulmonary/Chest: Effort normal.  Neurological: He is alert and oriented to person, place, and time.  Skin: Skin is warm and dry.  Psychiatric: He has a normal mood and affect. His behavior is normal.  Nursing note and vitals reviewed.    Imaging: No results found.  Labs:  CBC:  Recent Labs  09/10/15 0451 09/11/15 0432 10/17/15 0720 10/18/15 0530  WBC 17.6* 13.4* 8.1 11.1*  HGB 12.9* 13.4 13.2 12.5*  HCT 38.3* 39.7 40.7 38.9*  PLT 175 194 218 214    COAGS:  Recent Labs  10/15/15 1500  INR 1.08  APTT 27    BMP:  Recent Labs  09/13/15 0905 10/15/15 1500 10/18/15 0530 11/22/15 1459  NA 134* 143 141 141  K 3.5 4.0 3.7 4.0  CL 97* 111 107 107  CO2 28 25 26 28   GLUCOSE 197* 96 113* 117*  BUN 37* 16 14 19   CALCIUM 8.3* 9.1 9.0 9.3  CREATININE 1.88* 1.29* 1.20 1.37*  GFRNONAA 38* 60* >60 57*  GFRAA 44* >60 >60 66    LIVER FUNCTION TESTS:  Recent Labs  08/31/15 1510 09/08/15 1545 09/11/15 0432 09/12/15 0440  BILITOT 0.8  --   --   --   AST 31  --   --   --   ALT 42  --   --   --   ALKPHOS 46  --   --   --   PROT 7.3  --   --   --   ALBUMIN 4.1 3.5 2.5* 2.8*    TUMOR MARKERS: No results for input(s): AFPTM, CEA, CA199, CHROMGRNA in the last 8760 hours.  Assessment and Plan:  Doing very well 1 month status post percutaneous thermal ablation of the left putative renal cell carcinoma.  His recovery was uncomplicated. He denies flank pain, hematuria or other issues. He continues to recover from his prior rhabdomyolysis.  1.) Return to clinic in 6 months (June  2017). He plans to see Dr. Noah Delaine in May. If Dr. Noah Delaine orders a CT scan or MRI for that May visit, we can use that imaging for our surveillance as well. If not, then we will order a renal protocol CT scan of the abdomen with and without contrast.   Electronically Signed: [place], [place] 11/27/2015, 4:18 PM   I spent a total of   15 Minutes in face to face in clinical consultation, greater than 50% of which was counseling/coordinating care for LEFT renal cell carcinoma

## 2016-03-26 ENCOUNTER — Other Ambulatory Visit (HOSPITAL_COMMUNITY): Payer: Self-pay | Admitting: Interventional Radiology

## 2016-03-26 DIAGNOSIS — N2889 Other specified disorders of kidney and ureter: Secondary | ICD-10-CM

## 2016-04-03 ENCOUNTER — Other Ambulatory Visit: Payer: Self-pay | Admitting: Urology

## 2016-04-25 ENCOUNTER — Encounter (HOSPITAL_COMMUNITY)
Admission: RE | Admit: 2016-04-25 | Discharge: 2016-04-25 | Disposition: A | Discharge status: Home / Self Care | Payer: BLUE CROSS/BLUE SHIELD | Source: Ambulatory Visit | Attending: Urology | Admitting: Urology

## 2016-04-25 ENCOUNTER — Encounter (HOSPITAL_COMMUNITY): Payer: Self-pay

## 2016-04-25 DIAGNOSIS — N1330 Unspecified hydronephrosis: Secondary | ICD-10-CM | POA: Diagnosis not present

## 2016-04-25 DIAGNOSIS — Z01818 Encounter for other preprocedural examination: Secondary | ICD-10-CM | POA: Diagnosis present

## 2016-04-25 NOTE — Patient Instructions (Addendum)
Morley Dedo  04/25/2016   Your procedure is scheduled on: 05/02/16  Report to [facility] Main  Entrance take [facility]  elevators to 3rd floor to  [place] at [place].  Call this number if you have problems the morning of surgery [phone]   Remember: ONLY 1 PERSON MAY GO WITH YOU TO SHORT STAY TO GET  READY MORNING OF [place].  Do not eat food or drink liquids :After Midnight.     Take these medicines the morning of surgery with A SIP OF WATER: Amlodipine (Norvasc), Fenofibrate, Metoprolol DO NOT TAKE ANY DIABETIC MEDICATIONS DAY OF YOUR SURGERY                               You may not have any metal on your body including hair pins and              piercings  Do not wear jewelry, make-up, lotions, powders or perfumes, deodorant             Do not wear nail polish.  Do not shave  48 hours prior to surgery.              Men may shave face and neck.   Do not bring valuables to the hospital. [place].  Contacts, dentures or bridgework may not be worn into surgery.  Leave suitcase in the car. After surgery it may be brought to your room.   Transportation: Olivia Mackie (wife) [phone]               Please read over the following fact sheets you were given: _____________________________________________________________________             [facility] - Preparing for Surgery Before surgery, you can play an important role.  Because skin is not sterile, your skin needs to be as free of germs as possible.  You can reduce the number of germs on your skin by washing with CHG (chlorahexidine gluconate) soap before surgery.  CHG is an antiseptic cleaner which kills germs and bonds with the skin to continue killing germs even after washing. Please DO NOT use if you have an allergy to CHG or antibacterial soaps.  If your skin becomes reddened/irritated stop using the CHG and inform your nurse when you arrive at  Short Stay. Do not shave (including legs and underarms) for at least 48 hours prior to the first CHG shower.  You may shave your face/neck. Please follow these instructions carefully:  1.  Shower with CHG Soap the night before surgery and the  morning of Surgery.  2.  If you choose to wash your hair, wash your hair first as usual with your  normal  shampoo.  3.  After you shampoo, rinse your hair and body thoroughly to remove the  shampoo.                           4.  Use CHG as you would any other liquid soap.  You can apply chg directly  to the skin and wash                       Gently  with a scrungie or clean washcloth.  5.  Apply the CHG Soap to your body ONLY FROM THE NECK DOWN.   Do not use on face/ open                           Wound or open sores. Avoid contact with eyes, ears mouth and genitals (private parts).                       Wash face,  Genitals (private parts) with your normal soap.             6.  Wash thoroughly, paying special attention to the area where your surgery  will be performed.  7.  Thoroughly rinse your body with warm water from the neck down.  8.  DO NOT shower/wash with your normal soap after using and rinsing off  the CHG Soap.                9.  Pat yourself dry with a clean towel.            10.  Wear clean pajamas.            11.  Place clean sheets on your bed the night of your first shower and do not  sleep with pets. Day of Surgery : Do not apply any lotions/deodorants the morning of surgery.  Please wear clean clothes to the hospital/surgery center.  FAILURE TO FOLLOW THESE INSTRUCTIONS MAY RESULT IN THE CANCELLATION OF YOUR SURGERY PATIENT SIGNATURE_________________________________  NURSE SIGNATURE__________________________________  ________________________________________________________________________

## 2016-04-25 NOTE — Pre-Procedure Instructions (Signed)
CBC/diff, CMP, Hgb A1C 04/12/16 on chart EKG 08-31-15 epic

## 2016-05-02 ENCOUNTER — Encounter (HOSPITAL_COMMUNITY)
Admission: RE | Disposition: A | Discharge status: Home / Self Care | Payer: Self-pay | Source: Ambulatory Visit | Attending: Urology

## 2016-05-02 ENCOUNTER — Ambulatory Visit (HOSPITAL_COMMUNITY): Payer: BLUE CROSS/BLUE SHIELD | Admitting: Anesthesiology

## 2016-05-02 ENCOUNTER — Encounter (HOSPITAL_COMMUNITY): Payer: Self-pay | Admitting: *Deleted

## 2016-05-02 ENCOUNTER — Ambulatory Visit (HOSPITAL_COMMUNITY)
Admission: RE | Admit: 2016-05-02 | Discharge: 2016-05-02 | Disposition: A | Discharge status: Home / Self Care | Payer: BLUE CROSS/BLUE SHIELD | Source: Ambulatory Visit | Attending: Urology | Admitting: Urology

## 2016-05-02 DIAGNOSIS — G4730 Sleep apnea, unspecified: Secondary | ICD-10-CM | POA: Insufficient documentation

## 2016-05-02 DIAGNOSIS — Z85528 Personal history of other malignant neoplasm of kidney: Secondary | ICD-10-CM | POA: Insufficient documentation

## 2016-05-02 DIAGNOSIS — Z79899 Other long term (current) drug therapy: Secondary | ICD-10-CM | POA: Diagnosis not present

## 2016-05-02 DIAGNOSIS — N131 Hydronephrosis with ureteral stricture, not elsewhere classified: Secondary | ICD-10-CM | POA: Diagnosis not present

## 2016-05-02 DIAGNOSIS — Z7984 Long term (current) use of oral hypoglycemic drugs: Secondary | ICD-10-CM | POA: Insufficient documentation

## 2016-05-02 DIAGNOSIS — Z6841 Body Mass Index (BMI) 40.0 and over, adult: Secondary | ICD-10-CM | POA: Insufficient documentation

## 2016-05-02 DIAGNOSIS — Z8042 Family history of malignant neoplasm of prostate: Secondary | ICD-10-CM | POA: Insufficient documentation

## 2016-05-02 DIAGNOSIS — E119 Type 2 diabetes mellitus without complications: Secondary | ICD-10-CM | POA: Diagnosis not present

## 2016-05-02 DIAGNOSIS — Z905 Acquired absence of kidney: Secondary | ICD-10-CM | POA: Insufficient documentation

## 2016-05-02 DIAGNOSIS — N1330 Unspecified hydronephrosis: Secondary | ICD-10-CM | POA: Diagnosis present

## 2016-05-02 DIAGNOSIS — I10 Essential (primary) hypertension: Secondary | ICD-10-CM | POA: Diagnosis not present

## 2016-05-02 DIAGNOSIS — Z87891 Personal history of nicotine dependence: Secondary | ICD-10-CM | POA: Diagnosis not present

## 2016-05-02 HISTORY — PX: CYSTOSCOPY WITH RETROGRADE PYELOGRAM, URETEROSCOPY AND STENT PLACEMENT: SHX5789

## 2016-05-02 LAB — GLUCOSE, CAPILLARY
GLUCOSE-CAPILLARY: 128 mg/dL — AB (ref 65–99)
Glucose-Capillary: 144 mg/dL — ABNORMAL HIGH (ref 65–99)

## 2016-05-02 SURGERY — CYSTOURETEROSCOPY, WITH RETROGRADE PYELOGRAM AND STENT INSERTION
Anesthesia: General | Laterality: Right

## 2016-05-02 MED ORDER — FENTANYL CITRATE (PF) 100 MCG/2ML IJ SOLN
INTRAMUSCULAR | Status: AC
  Filled 2016-05-02: qty 2

## 2016-05-02 MED ORDER — SODIUM CHLORIDE 0.9 % IR SOLN
Status: DC | PRN
  Administered 2016-05-02: 5000 mL

## 2016-05-02 MED ORDER — PROPOFOL 10 MG/ML IV BOLUS
INTRAVENOUS | Status: DC | PRN
  Administered 2016-05-02: 250 mg via INTRAVENOUS

## 2016-05-02 MED ORDER — LIDOCAINE HCL 2 % EX GEL
CUTANEOUS | Status: AC
  Filled 2016-05-02: qty 5

## 2016-05-02 MED ORDER — BELLADONNA ALKALOIDS-OPIUM 16.2-60 MG RE SUPP
RECTAL | Status: DC | PRN
  Administered 2016-05-02: 1 via RECTAL

## 2016-05-02 MED ORDER — PROPOFOL 10 MG/ML IV BOLUS
INTRAVENOUS | Status: AC
  Filled 2016-05-02: qty 20

## 2016-05-02 MED ORDER — LACTATED RINGERS IV SOLN: INTRAVENOUS | Status: DC

## 2016-05-02 MED ORDER — FENTANYL CITRATE (PF) 100 MCG/2ML IJ SOLN: 25.0000 ug | INTRAMUSCULAR | Status: DC | PRN

## 2016-05-02 MED ORDER — DEXAMETHASONE SODIUM PHOSPHATE 4 MG/ML IJ SOLN
INTRAMUSCULAR | Status: DC | PRN
  Administered 2016-05-02: 4 mg via INTRAVENOUS

## 2016-05-02 MED ORDER — DEXAMETHASONE SODIUM PHOSPHATE 10 MG/ML IJ SOLN
INTRAMUSCULAR | Status: AC
  Filled 2016-05-02: qty 1

## 2016-05-02 MED ORDER — 0.9 % SODIUM CHLORIDE (POUR BTL) OPTIME
TOPICAL | Status: DC | PRN
  Administered 2016-05-02: 1000 mL

## 2016-05-02 MED ORDER — FENTANYL CITRATE (PF) 100 MCG/2ML IJ SOLN
INTRAMUSCULAR | Status: DC | PRN
  Administered 2016-05-02: 50 ug via INTRAVENOUS
  Administered 2016-05-02 (×4): 25 ug via INTRAVENOUS

## 2016-05-02 MED ORDER — BELLADONNA ALKALOIDS-OPIUM 16.2-60 MG RE SUPP
RECTAL | Status: AC
  Filled 2016-05-02: qty 1

## 2016-05-02 MED ORDER — LIDOCAINE HCL (CARDIAC) 20 MG/ML IV SOLN
INTRAVENOUS | Status: AC
Start: 2016-05-02 — End: 2016-05-02
  Filled 2016-05-02: qty 5

## 2016-05-02 MED ORDER — LACTATED RINGERS IV SOLN
INTRAVENOUS | Status: DC
  Administered 2016-05-02: 10:00:00 via INTRAVENOUS

## 2016-05-02 MED ORDER — PHENAZOPYRIDINE HCL 200 MG PO TABS: 200.0000 mg | ORAL_TABLET | Freq: Three times a day (TID) | ORAL | Status: DC | PRN

## 2016-05-02 MED ORDER — ONDANSETRON HCL 4 MG/2ML IJ SOLN
INTRAMUSCULAR | Status: AC
  Filled 2016-05-02: qty 2

## 2016-05-02 MED ORDER — CIPROFLOXACIN IN D5W 400 MG/200ML IV SOLN
INTRAVENOUS | Status: AC
  Filled 2016-05-02: qty 200

## 2016-05-02 MED ORDER — LIDOCAINE HCL 2 % EX GEL
CUTANEOUS | Status: DC | PRN
  Administered 2016-05-02: 1 via URETHRAL

## 2016-05-02 MED ORDER — TRAMADOL HCL 50 MG PO TABS: 50.0000 mg | ORAL_TABLET | Freq: Four times a day (QID) | ORAL | Status: DC | PRN

## 2016-05-02 MED ORDER — IOHEXOL 300 MG/ML  SOLN
INTRAMUSCULAR | Status: DC | PRN
  Administered 2016-05-02: 60 mL

## 2016-05-02 MED ORDER — LIDOCAINE 2% (20 MG/ML) 5 ML SYRINGE
INTRAMUSCULAR | Status: DC | PRN
  Administered 2016-05-02: 80 mg via INTRAVENOUS

## 2016-05-02 MED ORDER — CIPROFLOXACIN IN D5W 400 MG/200ML IV SOLN
400.0000 mg | INTRAVENOUS | Status: AC
  Administered 2016-05-02: 400 mg via INTRAVENOUS

## 2016-05-02 MED ORDER — MIDAZOLAM HCL 2 MG/2ML IJ SOLN
INTRAMUSCULAR | Status: AC
  Filled 2016-05-02: qty 2

## 2016-05-02 MED ORDER — MIDAZOLAM HCL 5 MG/5ML IJ SOLN
INTRAMUSCULAR | Status: DC | PRN
  Administered 2016-05-02: 2 mg via INTRAVENOUS

## 2016-05-02 MED ORDER — ONDANSETRON HCL 4 MG/2ML IJ SOLN
INTRAMUSCULAR | Status: DC | PRN
  Administered 2016-05-02: 4 mg via INTRAVENOUS

## 2016-05-02 SURGICAL SUPPLY — 25 items
BAG URO CATCHER STRL LF (MISCELLANEOUS) ×3 IMPLANT
BASKET DAKOTA 1.9FR 11X120 (BASKET) IMPLANT
BASKET ZERO TIP NITINOL 2.4FR (BASKET) IMPLANT
BRUSH URET BIOPSY 3F (UROLOGICAL SUPPLIES) ×3 IMPLANT
CATH URET 5FR 28IN OPEN ENDED (CATHETERS) ×3 IMPLANT
CLOTH BEACON ORANGE TIMEOUT ST (SAFETY) ×3 IMPLANT
FIBER LASER FLEXIVA 1000 (UROLOGICAL SUPPLIES) IMPLANT
FIBER LASER FLEXIVA 365 (UROLOGICAL SUPPLIES) IMPLANT
FIBER LASER FLEXIVA 550 (UROLOGICAL SUPPLIES) IMPLANT
FIBER LASER TRAC TIP (UROLOGICAL SUPPLIES) IMPLANT
GLOVE BIOGEL M STRL SZ7.5 (GLOVE) ×3 IMPLANT
GOWN STRL REUS W/TWL XL LVL3 (GOWN DISPOSABLE) ×3 IMPLANT
GUIDEWIRE ANG ZIPWIRE 038X150 (WIRE) ×3 IMPLANT
GUIDEWIRE STR DUAL SENSOR (WIRE) ×6 IMPLANT
KIT BALLN UROMAX 15FX4 (MISCELLANEOUS) ×1 IMPLANT
KIT BALLN UROMAX 26 75X4 (MISCELLANEOUS) ×2
MANIFOLD NEPTUNE II (INSTRUMENTS) ×3 IMPLANT
PACK CYSTO (CUSTOM PROCEDURE TRAY) ×3 IMPLANT
SHEATH ACCESS URETERAL 24CM (SHEATH) IMPLANT
SHEATH ACCESS URETERAL 38CM (SHEATH) IMPLANT
SHEATH ACCESS URETERAL 54CM (SHEATH) IMPLANT
STENT URET 6FRX26 CONTOUR (STENTS) ×3 IMPLANT
TUBING CONNECTING 10 (TUBING) ×2 IMPLANT
TUBING CONNECTING 10' (TUBING) ×1
WIRE COONS/BENSON .038X145CM (WIRE) ×3 IMPLANT

## 2016-05-02 NOTE — Anesthesia Procedure Notes (Signed)
Procedure Name: LMA Insertion Date/Time: 05/02/2016 10:20 AM Performed by: Denna Haggard D Pre-anesthesia Checklist: Patient identified, Emergency Drugs available, Suction available and Patient being monitored Patient Re-evaluated:Patient Re-evaluated prior to inductionOxygen Delivery Method: Circle system utilized Preoxygenation: Pre-oxygenation with 100% oxygen Intubation Type: IV induction Ventilation: Mask ventilation without difficulty LMA: LMA with gastric port inserted LMA Size: 4.0 and 5.0 Number of attempts: 1 Airway Equipment and Method: Bite block Placement Confirmation: positive ETCO2 Tube secured with: Tape Dental Injury: Teeth and Oropharynx as per pre-operative assessment

## 2016-05-02 NOTE — Anesthesia Preprocedure Evaluation (Addendum)
Anesthesia Evaluation  Patient identified by MRN, date of birth, ID band Patient awake    Reviewed: Allergy & Precautions, H&P , NPO status , Patient's Chart, lab work & pertinent test results, reviewed documented beta blocker date and time   History of Anesthesia Complications Negative for: history of anesthetic complications  Airway Mallampati: II  TM Distance: >3 FB Neck ROM: full    Dental no notable dental hx. (+) Dental Advisory Given, Teeth Intact   Pulmonary sleep apnea and Continuous Positive Airway Pressure Ventilation , former smoker,    Pulmonary exam normal breath sounds clear to auscultation       Cardiovascular hypertension, Pt. on home beta blockers and Pt. on medications  Rhythm:Regular Rate:Normal     Neuro/Psych negative neurological ROS  negative psych ROS   GI/Hepatic negative GI ROS, Neg liver ROS,   Endo/Other  diabetes, Well Controlled, Type 2, Oral Hypoglycemic AgentsMorbid obesity  Renal/GU Renal diseaseRenal cell cancer  negative genitourinary   Musculoskeletal   Abdominal (+) + obese,   Peds  Hematology negative hematology ROS (+)   Anesthesia Other Findings   Reproductive/Obstetrics negative OB ROS                           Anesthesia Physical Anesthesia Plan  ASA: III  Anesthesia Plan: General   Post-op Pain Management:    Induction: Intravenous  Airway Management Planned: LMA  Additional Equipment:   Intra-op Plan:   Post-operative Plan: Extubation in OR  Informed Consent: I have reviewed the patients History and Physical, chart, labs and discussed the procedure including the risks, benefits and alternatives for the proposed anesthesia with the patient or authorized representative who has indicated his/her understanding and acceptance.   Dental advisory given  Plan Discussed with: CRNA  Anesthesia Plan Comments:        Anesthesia Quick  Evaluation                                  Anesthesia Evaluation  Patient identified by MRN, date of birth, ID band  Reviewed: Allergy & Precautions, NPO status , Patient's Chart, lab work & pertinent test results  Airway Mallampati: III  TM Distance: >3 FB Neck ROM: Full    Dental  (+) Teeth Intact, Dental Advisory Given   Pulmonary sleep apnea , former smoker,    breath sounds clear to auscultation       Cardiovascular hypertension, + Peripheral Vascular Disease   Rhythm:Regular Rate:Normal     Neuro/Psych    GI/Hepatic negative GI ROS,   Endo/Other  diabetes  Renal/GU Renal disease     Musculoskeletal  (+) Arthritis ,   Abdominal (+) + obese,   Peds  Hematology   Anesthesia Other Findings   Reproductive/Obstetrics                            Anesthesia Physical Anesthesia Plan  ASA: III  Anesthesia Plan: General   Post-op Pain Management:    Induction: Intravenous  Airway Management Planned: Oral ETT and Video Laryngoscope Planned  Additional Equipment:   Intra-op Plan:   Post-operative Plan: Extubation in OR  Informed Consent: I have reviewed the patients History and Physical, chart, labs and discussed the procedure including the risks, benefits and alternatives for the proposed anesthesia with the patient or authorized representative who has indicated  his/her understanding and acceptance.   Dental advisory given  Plan Discussed with: CRNA and Surgeon  Anesthesia Plan Comments:        Anesthesia Quick Evaluation

## 2016-05-02 NOTE — Discharge Instructions (Signed)
DISCHARGE INSTRUCTIONS FOR KIDNEY STONE/URETERAL STENT   MEDICATIONS:  1.  Resume all your other meds from home - except do not take any extra narcotic pain meds that you may have at home.  2. Pyridium is to help with the burning/stinging when you urinate. 3. Tramadol is for moderate/severe pain, otherwise taking upto 1000 mg every 6 hours of plainTylenol will help treat your pain.    ACTIVITY:  1. No strenuous activity x 1week  2. No driving while on narcotic pain medications  3. Drink plenty of water  4. Continue to walk at home - you can still get blood clots when you are at home, so keep active, but don't over do it.  5. May return to work/school tomorrow or when you feel ready   BATHING:  1. You can shower and we recommend daily showers    SIGNS/SYMPTOMS TO CALL:  Please call us if you have a fever greater than 101.5, uncontrolled nausea/vomiting, uncontrolled pain, dizziness, unable to urinate, bloody urine, chest pain, shortness of breath, leg swelling, leg pain, redness around wound, drainage from wound, or any other concerns or questions.   You can reach [place] at [phone].   FOLLOW-UP:  1. We will schedule you for follow-up in 1 month for stent removal.

## 2016-05-02 NOTE — Op Note (Signed)
Preoperative diagnosis:  1. Right-sided hydronephrosis   Postoperative diagnosis:  1. Same   Procedure: 1. Bilateral retrograde pyelograms with interpretation 2. Right diagnostic ureteroscopy with brush biopsy of the proximal ureter 3. Right ureteral balloon dilation 4. Right ureteral stent placement  Surgeon: Ardis Hughs, MD  Anesthesia: General  Complications: None  Intraoperative findings:  #1:10 mL of Omnipaque contrast was instilled to the patient's left distal ureter and a retrograde pyelogram was performed. The right ureter was noted to have a normal colon with a normal upper tract/collecting system. The calyces were sharp without any filling defects. #2: Additional 10 mL of Omnipaque contrast was instilled into the patient's right proximal ureter and retrograde pyelogram was performed. The stent is treated nor over ureter to approximately L5 were there was a narrowed segment from proximally 1 cm followed by torturous and dilated ureter proximal to that. There are no filling defects. The contrast drained briskly. #3: Ureteroscopy demonstrated no clear evidence of an ischemic stricture, was able to get the 4.5 French needle tip semirigid ureteroscope across the lesion without significant force or trauma. There were no lesions or clear mucosal abnormalities. A brush biopsy was obtained from the segment sent for cytology. #4: Using a 4 French 15 cm balloon dilator the strictured area was dilated and a 6 [place] times 26 cm double-J ureteral stent was placed.  EBL: Minimal  Specimens: None  Indication: Lawrence [name] is a 60 y.o. patient with bilateral renal cell carcinoma status post right partial nephrectomy and left cryoablation of his lesions. On follow-up CT scan 6 months following his procedure he was noted to have no evidence of recurrence but he did have right-sided hydronephrosis and an area around the proximal ureter that was read as either a malignant process  consistent with metastatic renal cell carcinoma or an ischemic stricture with periureteral stranding.  After reviewing the management options for treatment, he elected to proceed with the above surgical procedure(s). We have discussed the potential benefits and risks of the procedure, side effects of the proposed treatment, the likelihood of the patient achieving the goals of the procedure, and any potential problems that might occur during the procedure or recuperation. Informed consent has been obtained.  Description of procedure:  The patient was taken to the operating room and general anesthesia was induced.  The patient was placed in the dorsal lithotomy position, prepped and draped in the usual sterile fashion, and preoperative antibiotics were administered. A preoperative time-out was performed.   21 French 30 cystoscope was gently passed to the patient's urethra and into the bladder. The patient was noted to have a high median bar. The bladder was normal, there were no stones, stitches, or tumor abnormalities. The ureteral orifices were orthotopic. A 5 French open-ended ureteral catheter was then inserted into the left proximal ureter and a retrograde pyelogram was performed with the above findings. I then turned my attention to the patient's right ureteral orifice and cannulated the right ureteral orifice with the 5 [place] open-ended ureteral catheter instilling contrast then performing a retrograde pyelogram as described above. The findings are as above. Next  I then advanced a 0.038 sensor wire through the open-ended catheter and into the right renal pelvis. I then emptied the patient's bladder and removed the cystoscope and exchanged for a 4.5 French needle tipped semirigid ureteroscope. I was able to cannulate the right ureteral orifice without significant trauma and using a second wire and "railroading" the scope between the 2 wires was able to  advance the scope into the mid ureter. I then  injected more contrast to delineate the area of stenosis which was noted to be around L 5. In attempt to visualize any mucosal abnormalities advanced the scope across the stricture and was able to advance it up to the right renal pelvis. Then point scope back was able to get good looks at the ureteral walls bilaterally noting no significant ureteral mucosal abnormality. There was what appeared to be extrinsic compression. There is no significant blanching in the area.  At this point I backed out the ureteroscope and left a second wire behind. I then passed a 4 cm times 15 French ureteral balloon dilator over the wire and under fluoroscopic guidance centered the balloon around the level of L5. I then inflated the balloon to 15 mL H2O for 2 minutes before taking the balloon down. This point I then backloaded the safety wire through the cystoscope and repassed the cystoscope into the patient's bladder. I then exchanged the safety wire for a 5 [place] open-ended ureteral catheter and repeated the retrograde pyelogram which demonstrated minor improvement in the patient's strictured area. I then replaced the wire and using the Seldinger technique advanced a 26 cm times 6 French double-J stent over the wire and into the right renal pelvis ensuring that there was a nice curl in the right renal pelvic prior to removing the wire. The scope was then brought to the bladder neck and the stent advanced until the distal aspect of the stent was at the bladder neck performed the wire was completely removed. There was a nice curl noted within the bladder. The bladder was then emptied and the scope was removed. A B and O suppository was placed in the patient's rectum as was lidocaine jelly in the patient's urethra.  The patient subsequent extubated and returned to the PACU in stable condition. Ardis Hughs, M.D.

## 2016-05-02 NOTE — Progress Notes (Signed)
O.K. For patient to go back to Short Stay- does not have to stay in PACU 2 hours- per Dr. Landry Dyke

## 2016-05-02 NOTE — Anesthesia Postprocedure Evaluation (Signed)
Anesthesia Post Note  Patient: Lawrence [name]  Procedure(s) Performed: Procedure(s) (LRB): CYSTO WITH BILATERAL RETROGRADE PYELOGRAM, RIGHT URETEROSCOPY, RIGHT URETERAL DILATION, RIGHT STENT PLACEMENT,  AND BRUSH BIOPSY (Right)  Patient location during evaluation: PACU Anesthesia Type: General Level of consciousness: awake and alert Pain management: pain level controlled Vital Signs Assessment: post-procedure vital signs reviewed and stable Respiratory status: spontaneous breathing, nonlabored ventilation, respiratory function stable and patient connected to nasal cannula oxygen Cardiovascular status: blood pressure returned to baseline and stable Postop Assessment: no signs of nausea or vomiting Anesthetic complications: no    Last Vitals:  Filed Vitals:   05/02/16 1245 05/02/16 1257  BP: 131/70 124/50  Pulse: 58 53  Temp: 36.7 C 36.6 C  Resp: 14 16    Last Pain:  Filed Vitals:   05/02/16 1310  PainSc: 2                  Salvatore Poe L

## 2016-05-02 NOTE — H&P (Signed)
History of Present Illness 17M s/p right RAL partial nephrectomy on 09/07/15. Path: T1a (3.7cm) - Renal Cell Ca, clear cell type, Furhman II/IV - margins negative Surgery complicated by Rhabdomyolysis in the left gluteal region - presumably from positioning. Discharged on 09/12/15. Status post transcutaneous thermal ablation of a left posterior small renal mass on 10/17/15. Imaging: 03/25/16: CT scan abdomen/pelvis with/without contrast: There are no evidence of recurrence of the lesions in either kidney. However, the right kidney does have mild to moderate hydronephrosis down to the proximal/mid ureter where there is a mildly enhancing ureteral lesion. Unclear whether this is within the ureter or not. However this is concerning for malignancy versus ischemic obstruction.  Labs: 03/25/16: BUN/creatinine: 22/1.6 (GFR 47), LFTs: WNL, lites: WNL Interval: The patient follows up today after having gone interval surveillance CT scan and labs. He is having a chest x-ray done in his own healthcare facility. He'll bring a copy of the chest x-ray. The patient feels well. He denies any ongoing or lingering pain. Denies any gross hematuria, dysuria, progressive voiding symptoms, or flank pain. The patient's weight is stable. He continues to have mild deficit in the left leg particularly with external rotation. This is lingering since the patient's surgery.   Past Medical History Problems  1. History of diabetes mellitus (Z86.39) 2. History of hypercholesterolemia (Z86.39) 3. History of hypertension (Z86.79) 4. History of osteoarthritis (Z87.39) 5. History of rhabdomyolysis (Z87.39) 6. Rule out Renal neoplasm [id number])  Surgical History Problems  1. History of Kidney Surgery Laparoscopic Partial Nephrectomy 2. History of Varicose Vein Ligation Current Meds 1. AmLODIPine Besylate 5 MG Oral Tablet; Therapy: [id number] to Recorded 2. Cyclobenzaprine HCl - 10 MG Oral Tablet; TAKE 1 TABLET 3 TIMES DAILY AS  NEEDED; Therapy: [id number] to (Evaluate:24Nov2016) Requested for: [id number]; Last Rx:[id number] Ordered 3. Fenofibrate 160 MG Oral Tablet; Therapy: [id number] to Recorded 4. Januvia 100 MG Oral Tablet; Therapy: (Recorded:23Aug2016) to Recorded 5. Metoprolol Succinate ER TB24; Therapy: ([id number]) to Recorded 6. OxyCODONE HCl - 5 MG Oral Capsule; TAKE 1 CAPSULE EVERY 4 TO 6 HOURS AS NEEDED FOR BREAKTHROUGH PAIN; Therapy: [id number] to (Evaluate:27Nov2016); Last Rx:[id number] Ordered 7. Prenatal-U 106.5-1 MG Oral Capsule; Therapy: [id number] to Recorded Allergies Medication  1. No Known Drug Allergies Family History  Problems  1. Family history of malignant neoplasm of prostate (Z80.42) 2. No pertinent family history : Mother 3. Family history of Prostate Cancer Social History Problems  1. Denied: History of Alcohol Use (History) 2. Caffeine Use 3. Former smoker [phone])  1.5ppdx20 years ago quit in 2005 4. Marital History - Currently Married  Vitals Vital Signs [Data Includes: Last 1 Day]  Recorded: [id number] 03:43PM  Height: 6 ft  Weight: 327 lb  BMI Calculated: 44.35 BSA Calculated: 2.63 Blood Pressure: 151 / 69 Results/Data Urine [Data Includes: Last 1 Day]   [id number]  COLOR YELLOW   APPEARANCE CLEAR   SPECIFIC GRAVITY 1.020   pH 5.5   GLUCOSE NEGATIVE   BILIRUBIN NEGATIVE   KETONE NEGATIVE   BLOOD NEGATIVE   PROTEIN NEGATIVE   NITRITE NEGATIVE   LEUKOCYTE ESTERASE NEGATIVE   Patient's urinalysis today is normal I've independently reviewed the patient's CT scan as well as his labs.  Assessment  Assessed  1. Renal cell carcinoma, right (C64.1) 2. 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 There is no evidence of metastatic  disease within the liver or enlarged lymphadenopathy. However, the patient now has a new finding of right hydronephrosis down to the level of the proximal/mid ureter where there is a question of an enhancing area there concerning for a neoplastic process.   Plan Health Maintenance  1. UA With REFLEX; [Do Not Release]; Status:Complete; [id number] 03:21PM  Discussion/Summary I discussed the findings of the CT scan with the patient quite a bit of detail. I recommended that we proceed to the operating room in the coming weeks for cystoscopy, retrograde pyelogram, and ureteroscopy/biopsy. This would be the obvious first up in the diagnosis of this area. If the area is ischemic or strictured, we would certainly dilate this area. This is a negative diagnostic ureteroscopy the patient would likely need a PET CT or MRI and possibly a biopsy.  I discussed the nature of ureteroscopy with the patient. I explained to him the procedure as well as the expected recovery. The patient understands that following the operation he would have stent.

## 2016-05-02 NOTE — Transfer of Care (Signed)
Immediate Anesthesia Transfer of Care Note  Patient: Lawrence [name]  Procedure(s) Performed: Procedure(s) (LRB): CYSTO WITH BILATERAL RETROGRADE PYELOGRAM, RIGHT URETEROSCOPY, RIGHT URETERAL DILATION, RIGHT STENT PLACEMENT,  AND BRUSH BIOPSY (Right)  Patient Location: PACU  Anesthesia Type: General  Level of Consciousness: awake, oriented, sedated and patient cooperative  Airway & Oxygen Therapy: Patient Spontanous Breathing and Patient connected to face mask oxygen  Post-op Assessment: Report given to PACU RN and Post -op Vital signs reviewed and stable  Post vital signs: Reviewed and stable  Complications: No apparent anesthesia complications

## 2016-05-04 ENCOUNTER — Encounter (HOSPITAL_COMMUNITY): Payer: Self-pay | Admitting: Urology

## 2016-07-18 ENCOUNTER — Other Ambulatory Visit: Payer: Self-pay | Admitting: Urology

## 2016-07-18 ENCOUNTER — Ambulatory Visit (HOSPITAL_COMMUNITY)
Admission: RE | Admit: 2016-07-18 | Discharge: 2016-07-18 | Disposition: A | Discharge status: Home / Self Care | Payer: BLUE CROSS/BLUE SHIELD | Source: Ambulatory Visit | Attending: Urology | Admitting: Urology

## 2016-07-18 DIAGNOSIS — J449 Chronic obstructive pulmonary disease, unspecified: Secondary | ICD-10-CM | POA: Insufficient documentation

## 2016-07-18 DIAGNOSIS — C641 Malignant neoplasm of right kidney, except renal pelvis: Secondary | ICD-10-CM | POA: Diagnosis present

## 2016-07-25 HISTORY — PX: COLONOSCOPY: SHX174

## 2016-10-20 IMAGING — CR DG CHEST 1V
1 series · 1 of 1 positions shown · non-contrast
Comparison: None.

CLINICAL DATA: Preoperative examination (CT tissue ablation)

EXAM:
CHEST 1 VIEW

[w chest pa]
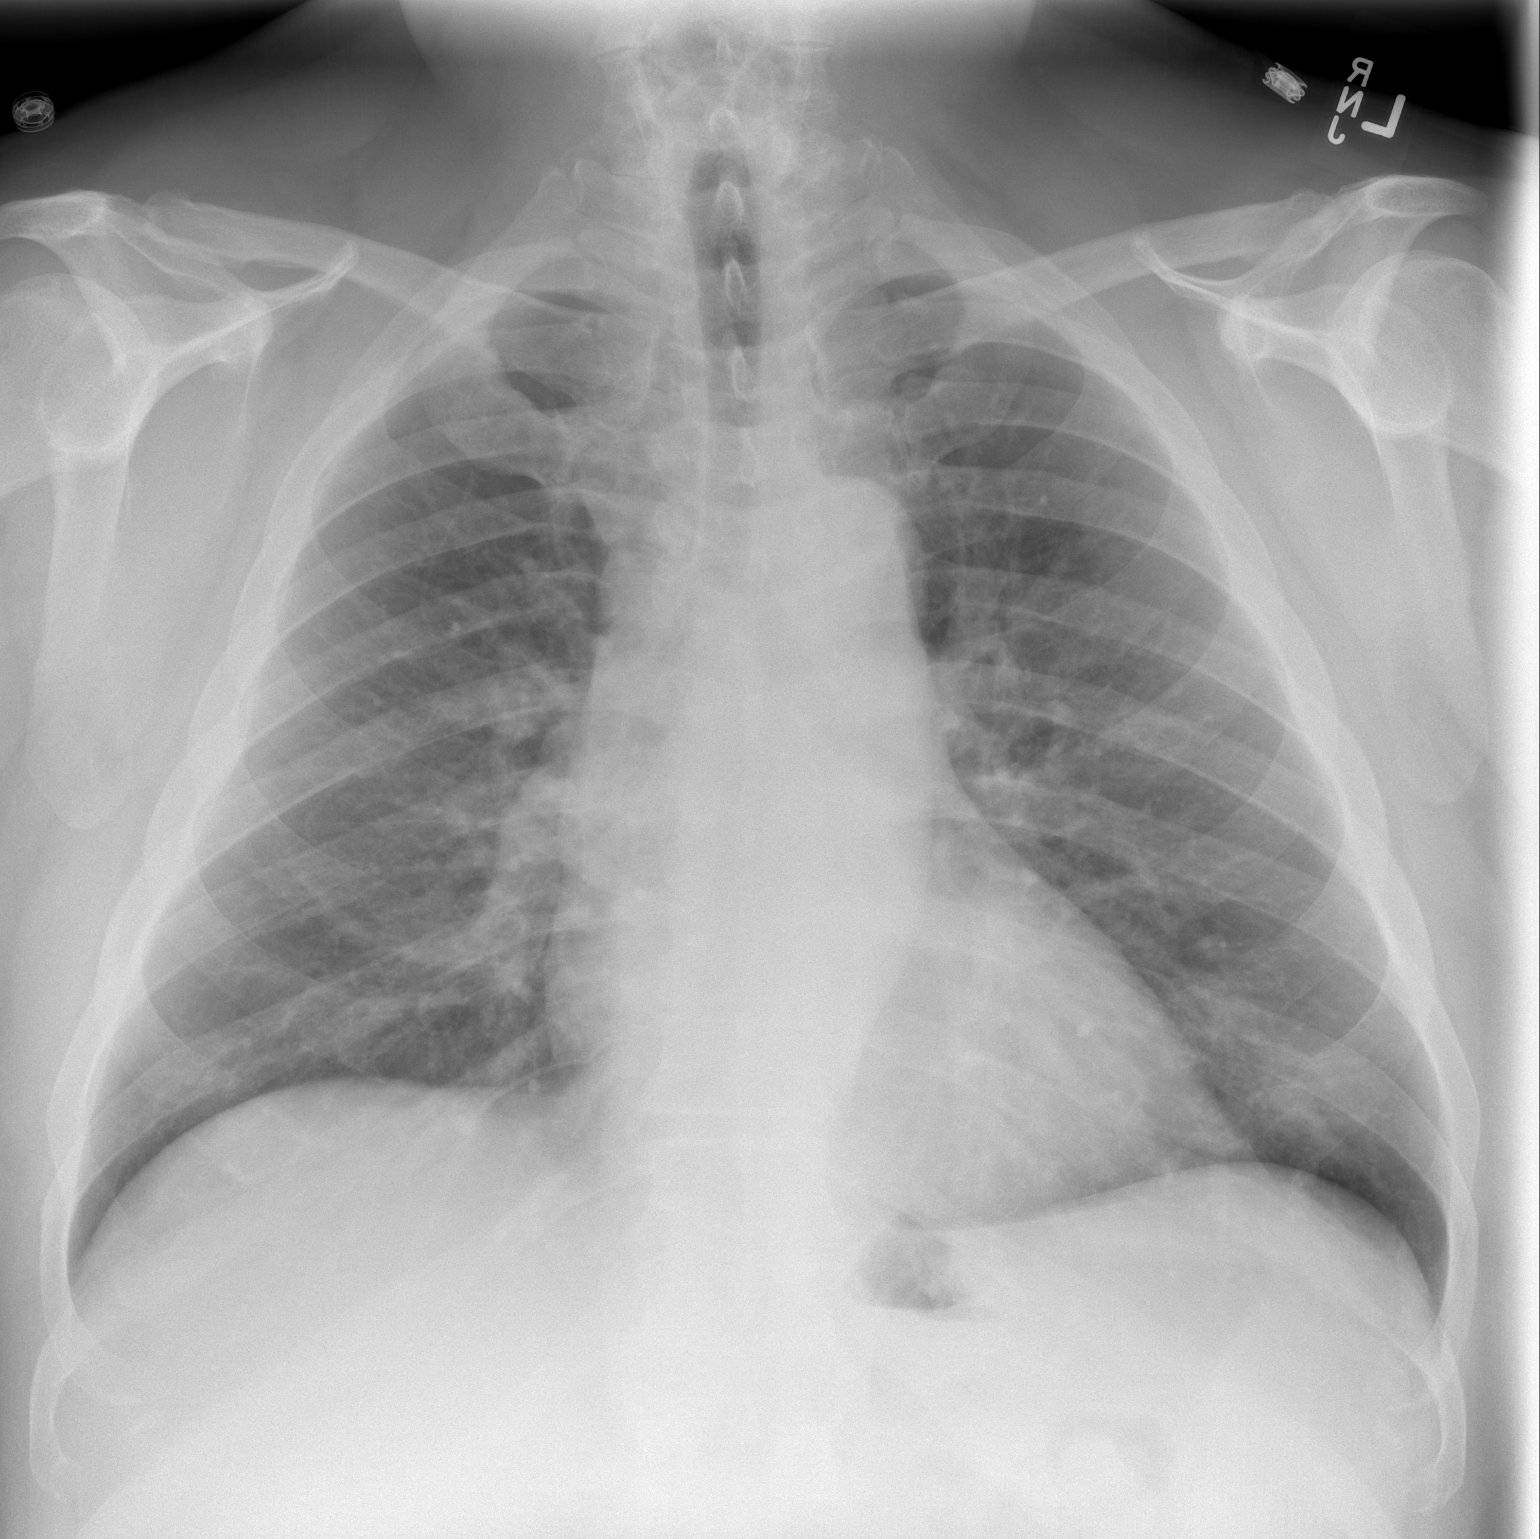

[1 of 1 positions shown; findings below may reference images not displayed]

FINDINGS: Borderline enlarged cardiac silhouette and mediastinal contours with
mild tortuosity of the thoracic aorta. Mild diffuse slightly nodular
thickening of the pulmonary interstitium. No focal airspace
opacities. No pleural effusion or pneumothorax. No evidence of
edema. No acute osseus abnormalities.
IMPRESSION: Borderline cardiomegaly and bronchitic change without acute
cardiopulmonary disease.

## 2017-03-26 DIAGNOSIS — E785 Hyperlipidemia, unspecified: Secondary | ICD-10-CM | POA: Insufficient documentation

## 2017-03-26 DIAGNOSIS — I10 Essential (primary) hypertension: Secondary | ICD-10-CM | POA: Insufficient documentation

## 2017-06-03 DIAGNOSIS — R569 Unspecified convulsions: Secondary | ICD-10-CM

## 2017-06-03 DIAGNOSIS — I671 Cerebral aneurysm, nonruptured: Secondary | ICD-10-CM

## 2017-06-03 HISTORY — PX: OTHER SURGICAL HISTORY: SHX169

## 2017-06-03 HISTORY — DX: Unspecified convulsions: R56.9

## 2017-06-03 HISTORY — DX: Cerebral aneurysm, nonruptured: I67.1

## 2017-06-20 DIAGNOSIS — J9690 Respiratory failure, unspecified, unspecified whether with hypoxia or hypercapnia: Secondary | ICD-10-CM

## 2017-06-20 DIAGNOSIS — G40219 Localization-related (focal) (partial) symptomatic epilepsy and epileptic syndromes with complex partial seizures, intractable, without status epilepticus: Secondary | ICD-10-CM | POA: Insufficient documentation

## 2017-06-20 HISTORY — DX: Respiratory failure, unspecified, unspecified whether with hypoxia or hypercapnia: J96.90

## 2017-06-29 DIAGNOSIS — Q283 Other malformations of cerebral vessels: Secondary | ICD-10-CM | POA: Insufficient documentation

## 2017-07-02 ENCOUNTER — Telehealth: Payer: Self-pay | Admitting: Radiology

## 2017-07-02 ENCOUNTER — Other Ambulatory Visit: Payer: Self-pay | Admitting: Urology

## 2017-07-02 ENCOUNTER — Other Ambulatory Visit (HOSPITAL_COMMUNITY): Payer: Self-pay | Admitting: Interventional Radiology

## 2017-07-02 DIAGNOSIS — N2889 Other specified disorders of kidney and ureter: Secondary | ICD-10-CM

## 2017-07-02 NOTE — Telephone Encounter (Signed)
Patient states that he being followed by Dr. Louis Meckel.  He does not want to schedule IR follow unless referred back to Dr Laurence Ferrari.   Kinney Sackmann Riki Rusk, RN 07/02/2017 2:17 PM

## 2017-07-22 IMAGING — DX DG CHEST 2V
3 series · 3 of 3 positions shown · non-contrast
Comparison: 10/17/2015.

CLINICAL DATA: 60-year-old former smoker with current history of
hypertension and diabetes, personal history of bilateral renal cell
carcinomas. Routine follow-up.

EXAM:
CHEST  2 VIEW

[chest pa (1 of 2)]
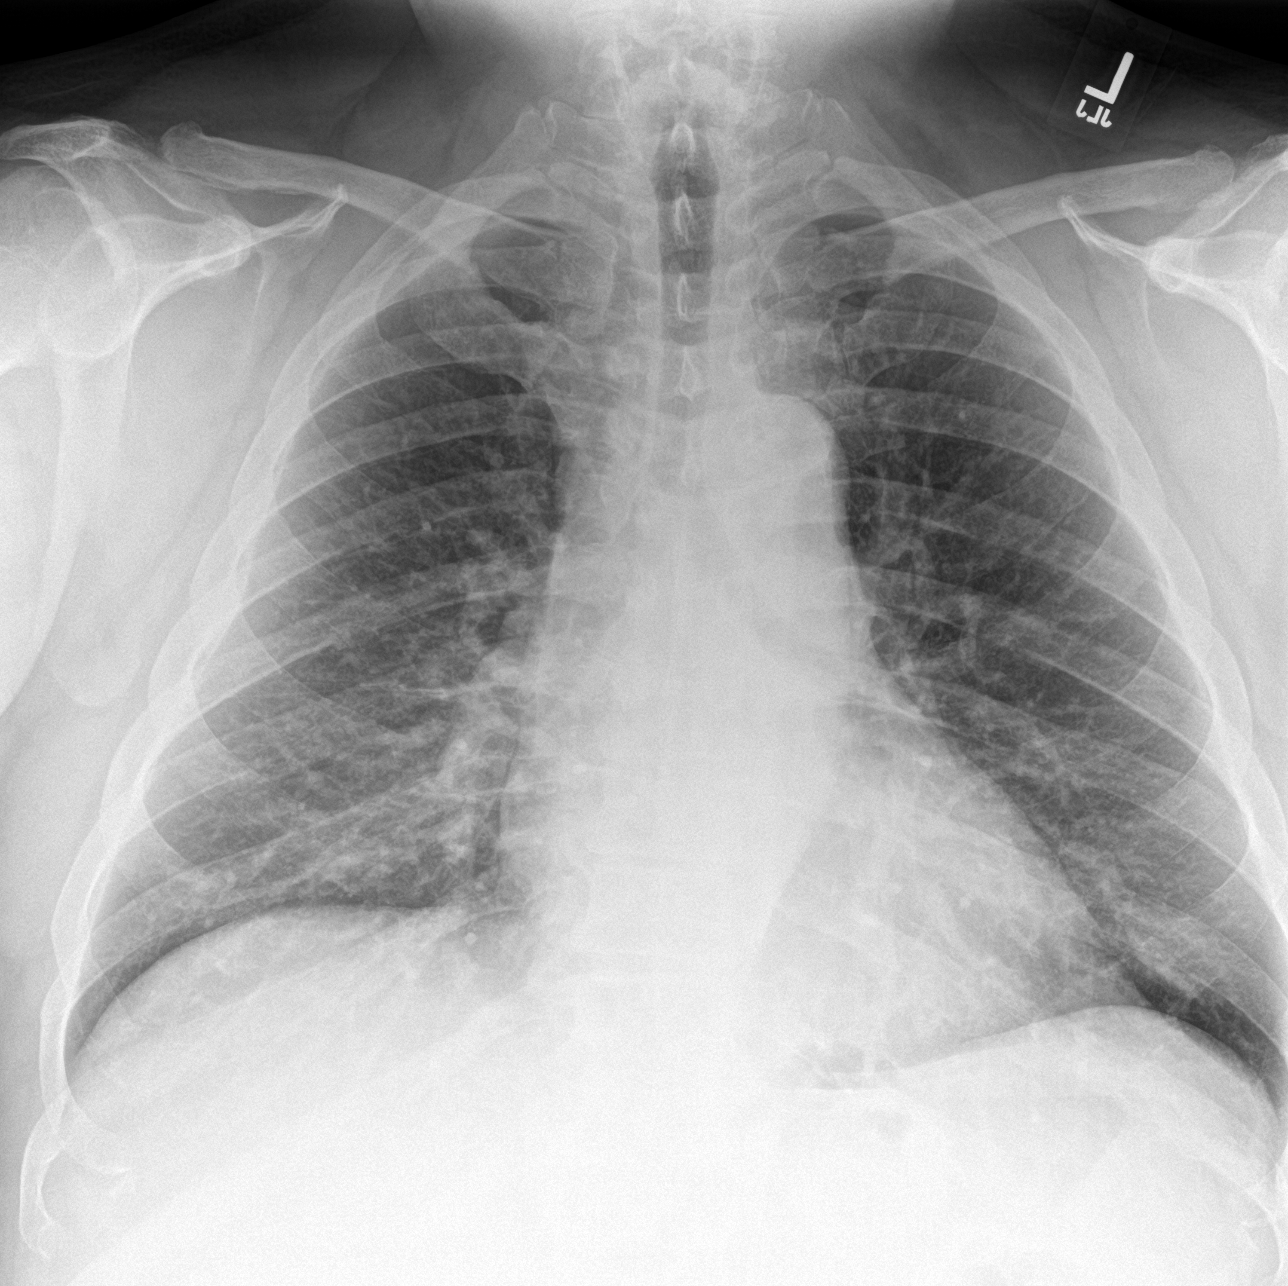

[chest lat]
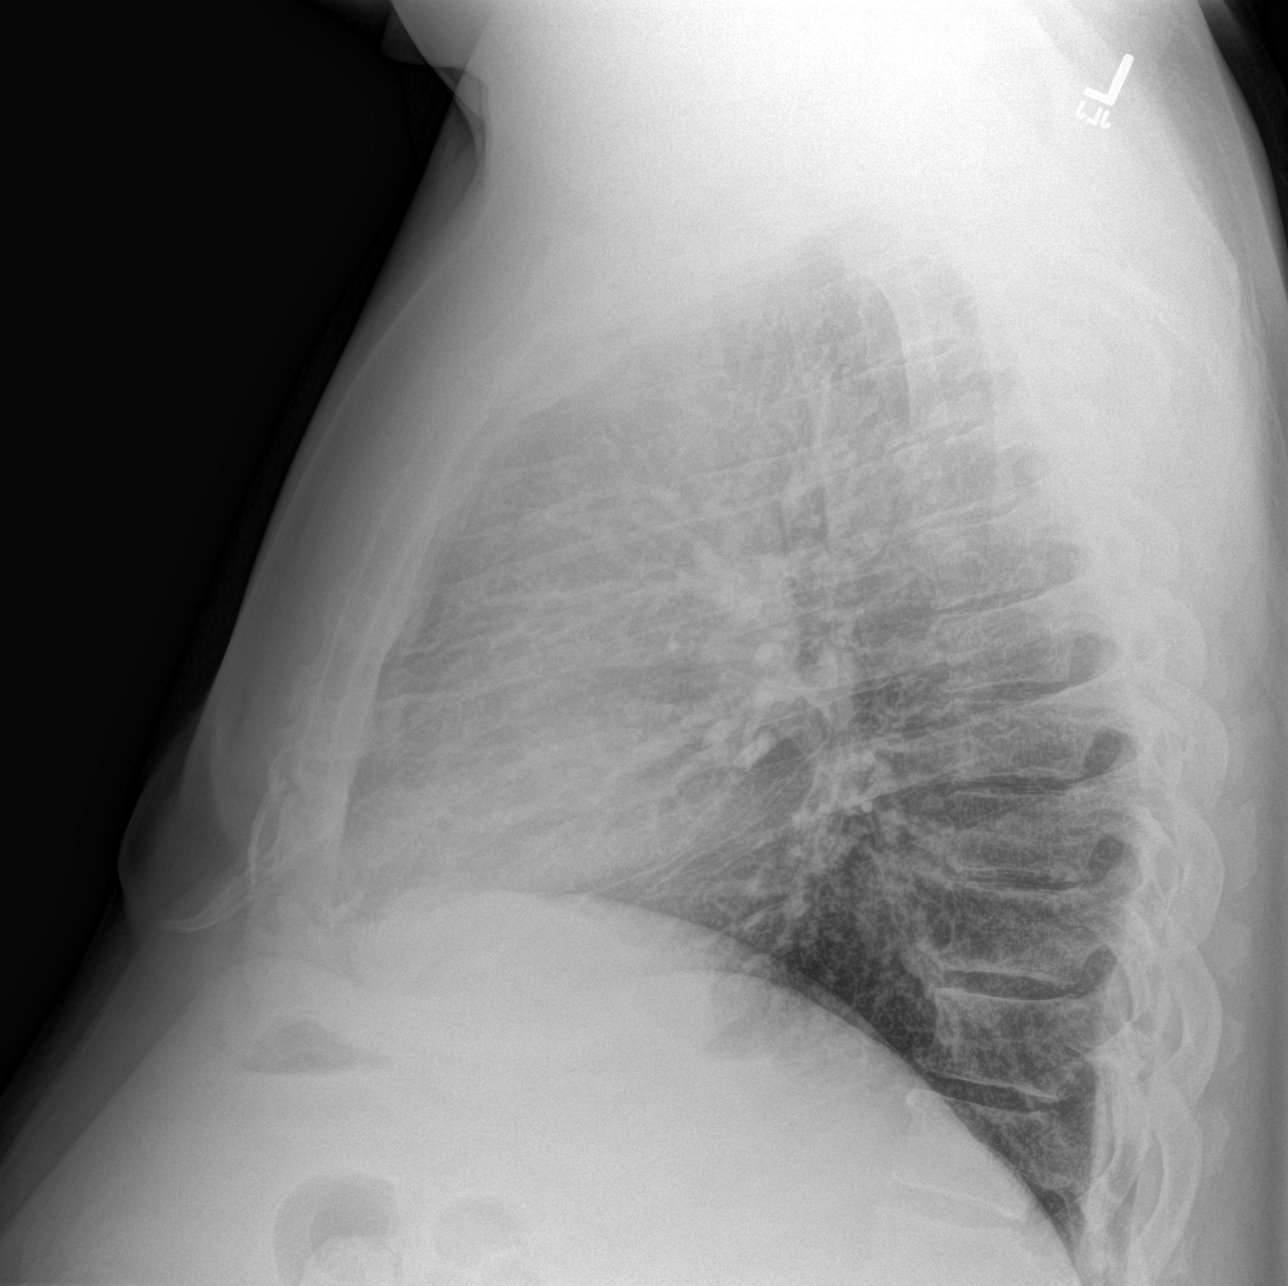

[chest pa (2 of 2)]
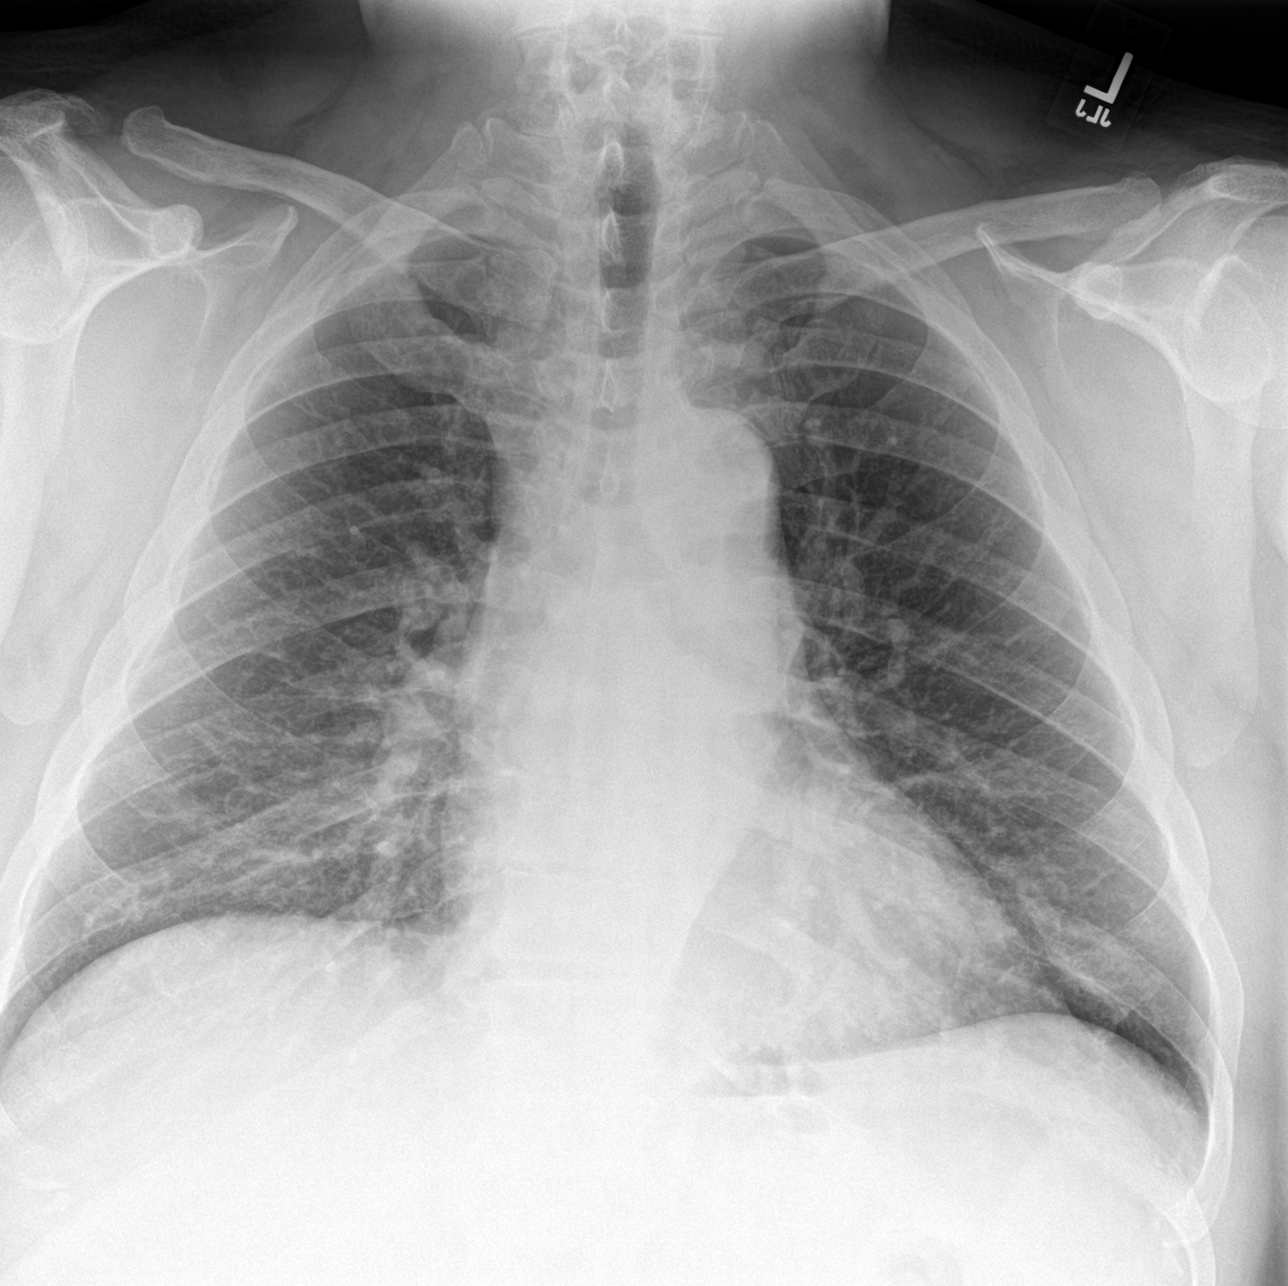

[3 of 3 positions shown; findings below may reference images not displayed]

FINDINGS: Cardiac silhouette normal in size, unchanged. Thoracic aorta
tortuous atherosclerotic, unchanged. Hilar and mediastinal contours
otherwise unremarkable. Prominent bronchovascular markings diffusely
and mild central peribronchial thickening, unchanged. Lungs
otherwise clear. No localized airspace consolidation. No pleural
effusions. No pneumothorax. Normal pulmonary vascularity. Mild
degenerative changes involving the thoracic spine.
IMPRESSION: No acute cardiopulmonary disease. Stable mild changes of chronic
bronchitis and/or asthma. No evidence of metastatic disease.

## 2017-09-01 ENCOUNTER — Encounter: Payer: Self-pay | Admitting: Neurology

## 2017-10-06 ENCOUNTER — Encounter (HOSPITAL_COMMUNITY): Payer: Self-pay | Admitting: *Deleted

## 2017-10-09 ENCOUNTER — Ambulatory Visit: Payer: Self-pay | Admitting: Orthopedic Surgery

## 2017-10-15 ENCOUNTER — Ambulatory Visit: Payer: Self-pay | Admitting: Orthopedic Surgery

## 2017-10-15 NOTE — H&P (View-Only) (Signed)
Lawrence [name] DOB: 28-Feb-1956 Married / Language: English / Race: White Male  H&P Date 10/15/17  Chief Complaint R knee pain   History of Present Illness  The patient is a 61 year old male who comes in today for a preoperative History and Physical. The patient is scheduled for a right total knee arthroplasty to be performed by Dr. Johnn Hai, MD at [facility] on October 22, 2017. Dr. Tonita Cong and the patient mutually agreed to proceed with a total knee replacement. Risks and benefits of the procedure were discussed including stiffness, suboptimal range of motion, persistent pain, infection requiring removal of prosthesis and reinsertion, need for prophylactic antibiotics in the future, for example, dental procedures, possible need for manipulation, revision in the future and also anesthetic complications including DVT, PE, etc. We discussed the perioperative course, time in the hospital, postoperative recovery and the need for elevation to control swelling. We also discussed the predicted range of motion and the probability that squatting and kneeling would be unobtainable in the future. In addition, postoperative anticoagulation was discussed. Provided illustrated handout and discussed it in detail. They will enroll in the total joint replacement educational forum at the hospital.  He would like to go straight to outpatient PT at [facility].  Problem List/Past Medical Hx Primary osteoarthritis of both knees (M17.0)  HTN  Allergies No Known Drug Allergies [04/13/2013]: NSAIDs  affects his kidneys   Family History Diabetes Mellitus  grandmother mothers side  Medication History  Metoprolol Tartrate (50MG  Tablet, Oral) Active. AndroGel (20.25 MG/1.25GM(1.62%) Gel, Transdermal) Active. Keppra XR (Oral) Specific strength unknown - Active. Januvia (100MG  Tablet, Oral) Active. Fenofibrate (160MG  Tablet, Oral) Active. AmLODIPine Besylate (5MG   Tablet, Oral) Active. Fish Oil Concentrate (1 (one) Oral) Specific strength unknown - Active. Vitamin B-12 (1 (one) Oral) Specific strength unknown - Active. Vitamin C (1 (one) Oral) Specific strength unknown - Active  Past Surgical History Colon Polyp Removal - Colonoscopy  Tonsillectomy   Review of Systems General Not Present- Chills, Fatigue, Fever, Memory Loss, Night Sweats, Weight Gain and Weight Loss. Skin Not Present- Eczema, Hives, Itching, Lesions and Rash. HEENT Not Present- Dentures, Double Vision, Headache, Hearing Loss, Tinnitus and Visual Loss. Respiratory Not Present- Allergies, Chronic Cough, Coughing up blood, Shortness of breath at rest and Shortness of breath with exertion. Cardiovascular Not Present- Chest Pain, Difficulty Breathing Lying Down, Murmur, Palpitations, Racing/skipping heartbeats and Swelling. Gastrointestinal Not Present- Abdominal Pain, Bloody Stool, Constipation, Diarrhea, Difficulty Swallowing, Heartburn, Jaundice, Loss of appetitie, Nausea and Vomiting. Male Genitourinary Present- Urinating at Night. Not Present- Blood in Urine, Discharge, Flank Pain, Incontinence, Painful Urination, Urgency, Urinary frequency, Urinary Retention and Weak urinary stream. Musculoskeletal Present- Joint Pain and Morning Stiffness. Not Present- Back Pain, Joint Swelling, Muscle Pain, Muscle Weakness and Spasms. Neurological Not Present- Blackout spells, Difficulty with balance, Dizziness, Paralysis, Tremor and Weakness. Psychiatric Not Present- Insomnia.  Physical Exam  General Mental Status -Alert, cooperative and good historian. General Appearance-pleasant, Not in acute distress. Orientation-Oriented X3. Build & Nutrition-Well nourished and Well developed.  Head and Neck Head-normocephalic, atraumatic . Neck Global Assessment - supple, no bruit auscultated on the right, no bruit auscultated on the left.  Eye Pupil - Bilateral-Regular and  Round. Motion - Bilateral-EOMI.  Chest and Lung Exam Auscultation Breath sounds - clear at anterior chest wall and clear at posterior chest wall. Adventitious sounds - No Adventitious sounds.  Cardiovascular Auscultation Rhythm - Regular rate and rhythm. Heart Sounds - S1 WNL and S2  WNL. Murmurs & Other Heart Sounds - Auscultation of the heart reveals - No Murmurs.  Abdomen Palpation/Percussion Tenderness - Abdomen is non-tender to palpation. Rigidity (guarding) - Abdomen is soft. Auscultation Auscultation of the abdomen reveals - Bowel sounds normal.  Male Genitourinary Not done, not pertinent to present illness  Musculoskeletal Patient slightly elevated BMI. Mainly abdominal. He walks an antalgic gait. Unable to take anti-inflammatories due to kidney side effect. He has a varus deformity bilaterally right greater than left. On the right is tender medial joint line patellofemoral pain compression range 0-130 no instability. No DVT patellofemoral pain compression. Quad is 5/5. Slight varus thrust.  Imaging AP standing lateral demonstrates end-stage osteoarthrosis varus deformity on the right near end-stage on the left.  Assessment & Plan Primary osteoarthritis of right knee (M17.11)  Pt with end-stage right knee DJD, bone-on-bone, refractory to conservative tx, scheduled for right total knee replacement by Dr. Tonita Cong on 10/22/17. We again discussed the procedure itself as well as risks, complications and alternatives, including but not limited to DVT, PE, infx, bleeding, failure of procedure, need for secondary procedure including manipulation, nerve injury, ongoing pain/symptoms, anesthesia risk, even stroke or death. Also discussed typical post-op protocols, activity restrictions, need for PT, flexion/extension exercises, time out of work. Discussed need for DVT ppx post-op per protocol. Discussed dental ppx and infx prevention. Also discussed limitations post-operatively such as  kneeling and squatting. All questions were answered. Patient desires to proceed with surgery as scheduled. Will hold supplements, ASA and NSAIDs accordingly. Will remain NPO after MN night before surgery. Will present to [facility] for pre-op testing. Anticipate hospital stay to include at least 2 midnights given medical history and to ensure proper pain control. Plan ASA 325mg  BID for DVT ppx post-op. Plan pain medication, Robaxin, Colace, Miralax. Plan home post-op with family members at home for assistance and straight to outpt PT at [facility]. Will follow up 10-14 days post-op for staple removal and xrays.  Plan right total knee replacement  Signed electronically by Cecilie Kicks, PA-C for Dr. Tonita Cong

## 2017-10-15 NOTE — H&P (Signed)
Lawrence [name] DOB: February 04, 1956 Married / Language: English / Race: White Male  H&P Date 10/15/17  Chief Complaint R knee pain   History of Present Illness  The patient is a 61 year old male who comes in today for a preoperative History and Physical. The patient is scheduled for a right total knee arthroplasty to be performed by Dr. Johnn Hai, MD at [facility] on October 22, 2017. Dr. Tonita Cong and the patient mutually agreed to proceed with a total knee replacement. Risks and benefits of the procedure were discussed including stiffness, suboptimal range of motion, persistent pain, infection requiring removal of prosthesis and reinsertion, need for prophylactic antibiotics in the future, for example, dental procedures, possible need for manipulation, revision in the future and also anesthetic complications including DVT, PE, etc. We discussed the perioperative course, time in the hospital, postoperative recovery and the need for elevation to control swelling. We also discussed the predicted range of motion and the probability that squatting and kneeling would be unobtainable in the future. In addition, postoperative anticoagulation was discussed. Provided illustrated handout and discussed it in detail. They will enroll in the total joint replacement educational forum at the hospital.  He would like to go straight to outpatient PT at [facility].  Problem List/Past Medical Hx Primary osteoarthritis of both knees (M17.0)  HTN  Allergies No Known Drug Allergies [04/13/2013]: NSAIDs  affects his kidneys   Family History Diabetes Mellitus  grandmother mothers side  Medication History  Metoprolol Tartrate (50MG  Tablet, Oral) Active. AndroGel (20.25 MG/1.25GM(1.62%) Gel, Transdermal) Active. Keppra XR (Oral) Specific strength unknown - Active. Januvia (100MG  Tablet, Oral) Active. Fenofibrate (160MG  Tablet, Oral) Active. AmLODIPine Besylate (5MG   Tablet, Oral) Active. Fish Oil Concentrate (1 (one) Oral) Specific strength unknown - Active. Vitamin B-12 (1 (one) Oral) Specific strength unknown - Active. Vitamin C (1 (one) Oral) Specific strength unknown - Active  Past Surgical History Colon Polyp Removal - Colonoscopy  Tonsillectomy   Review of Systems General Not Present- Chills, Fatigue, Fever, Memory Loss, Night Sweats, Weight Gain and Weight Loss. Skin Not Present- Eczema, Hives, Itching, Lesions and Rash. HEENT Not Present- Dentures, Double Vision, Headache, Hearing Loss, Tinnitus and Visual Loss. Respiratory Not Present- Allergies, Chronic Cough, Coughing up blood, Shortness of breath at rest and Shortness of breath with exertion. Cardiovascular Not Present- Chest Pain, Difficulty Breathing Lying Down, Murmur, Palpitations, Racing/skipping heartbeats and Swelling. Gastrointestinal Not Present- Abdominal Pain, Bloody Stool, Constipation, Diarrhea, Difficulty Swallowing, Heartburn, Jaundice, Loss of appetitie, Nausea and Vomiting. Male Genitourinary Present- Urinating at Night. Not Present- Blood in Urine, Discharge, Flank Pain, Incontinence, Painful Urination, Urgency, Urinary frequency, Urinary Retention and Weak urinary stream. Musculoskeletal Present- Joint Pain and Morning Stiffness. Not Present- Back Pain, Joint Swelling, Muscle Pain, Muscle Weakness and Spasms. Neurological Not Present- Blackout spells, Difficulty with balance, Dizziness, Paralysis, Tremor and Weakness. Psychiatric Not Present- Insomnia.  Physical Exam  General Mental Status -Alert, cooperative and good historian. General Appearance-pleasant, Not in acute distress. Orientation-Oriented X3. Build & Nutrition-Well nourished and Well developed.  Head and Neck Head-normocephalic, atraumatic . Neck Global Assessment - supple, no bruit auscultated on the right, no bruit auscultated on the left.  Eye Pupil - Bilateral-Regular and  Round. Motion - Bilateral-EOMI.  Chest and Lung Exam Auscultation Breath sounds - clear at anterior chest wall and clear at posterior chest wall. Adventitious sounds - No Adventitious sounds.  Cardiovascular Auscultation Rhythm - Regular rate and rhythm. Heart Sounds - S1 WNL and S2  WNL. Murmurs & Other Heart Sounds - Auscultation of the heart reveals - No Murmurs.  Abdomen Palpation/Percussion Tenderness - Abdomen is non-tender to palpation. Rigidity (guarding) - Abdomen is soft. Auscultation Auscultation of the abdomen reveals - Bowel sounds normal.  Male Genitourinary Not done, not pertinent to present illness  Musculoskeletal Patient slightly elevated BMI. Mainly abdominal. He walks an antalgic gait. Unable to take anti-inflammatories due to kidney side effect. He has a varus deformity bilaterally right greater than left. On the right is tender medial joint line patellofemoral pain compression range 0-130 no instability. No DVT patellofemoral pain compression. Quad is 5/5. Slight varus thrust.  Imaging AP standing lateral demonstrates end-stage osteoarthrosis varus deformity on the right near end-stage on the left.  Assessment & Plan Primary osteoarthritis of right knee (M17.11)  Pt with end-stage right knee DJD, bone-on-bone, refractory to conservative tx, scheduled for right total knee replacement by Dr. Tonita Cong on 10/22/17. We again discussed the procedure itself as well as risks, complications and alternatives, including but not limited to DVT, PE, infx, bleeding, failure of procedure, need for secondary procedure including manipulation, nerve injury, ongoing pain/symptoms, anesthesia risk, even stroke or death. Also discussed typical post-op protocols, activity restrictions, need for PT, flexion/extension exercises, time out of work. Discussed need for DVT ppx post-op per protocol. Discussed dental ppx and infx prevention. Also discussed limitations post-operatively such as  kneeling and squatting. All questions were answered. Patient desires to proceed with surgery as scheduled. Will hold supplements, ASA and NSAIDs accordingly. Will remain NPO after MN night before surgery. Will present to [facility] for pre-op testing. Anticipate hospital stay to include at least 2 midnights given medical history and to ensure proper pain control. Plan ASA 325mg  BID for DVT ppx post-op. Plan pain medication, Robaxin, Colace, Miralax. Plan home post-op with family members at home for assistance and straight to outpt PT at [facility]. Will follow up 10-14 days post-op for staple removal and xrays.  Plan right total knee replacement  Signed electronically by Cecilie Kicks, PA-C for Dr. Tonita Cong

## 2017-10-16 ENCOUNTER — Encounter (HOSPITAL_COMMUNITY): Payer: Self-pay

## 2017-10-16 NOTE — Patient Instructions (Addendum)
Lawrence [name]  10/16/2017   Your procedure is scheduled on: Thursday, Dec. 20, 2018   Report to [facility] Main  Entrance   Take [place]  elevators to 3rd floor to  [place] at 5:30 AM.    Call this number if you have problems the morning of surgery [phone]    Remember: ONLY 1 PERSON MAY GO WITH YOU TO SHORT STAY TO GET  READY MORNING OF Lawrence [name].   Do not eat food or drink liquids :After Midnight.   Take these medicines the morning of surgery with A SIP OF WATER: Amlodipine, Fenofibrate, Metoprolol   DO NOT TAKE ANY DIABETIC MEDICATIONS DAY OF YOUR SURGERY                               You may not have any metal on your body including jewelry and body piercings             Do not wear lotions, powders, perfumes, or deodorant                          Men may shave face and neck.   Do not bring valuables to the hospital. Lawrence [name].   Contacts, dentures or bridgework may not be worn into surgery.   Leave suitcase in the car. After surgery it may be brought to your room.              Please read over the following fact sheets you were given: _____________________________________________________________________ [facility] - Preparing for Surgery Before surgery, you can play an important role.  Because skin is not sterile, your skin needs to be as free of germs as possible.  You can reduce the number of germs on your skin by washing with CHG (chlorahexidine gluconate) soap before surgery.  CHG is an antiseptic cleaner which kills germs and bonds with the skin to continue killing germs even after washing. Please DO NOT use if you have an allergy to CHG or antibacterial soaps.  If your skin becomes reddened/irritated stop using the CHG and inform your nurse when you arrive at Short Stay. Do not shave (including legs and underarms) for at least 48 hours prior to the first CHG shower.  You  may shave your face/neck.  Please follow these instructions carefully:  1.  Shower with CHG Soap the night before surgery and the  morning of surgery.  2.  If you choose to wash your hair, wash your hair first as usual with your normal  shampoo.  3.  After you shampoo, rinse your hair and body thoroughly to remove the shampoo.                             4.  Use CHG as you would any other liquid soap.  You can apply chg directly to the skin and wash.  Gently with a scrungie or clean washcloth.  5.  Apply the CHG Soap to your body ONLY FROM THE NECK DOWN.   Do   not use on face/ open  Wound or open sores. Avoid contact with eyes, ears mouth and   genitals (private parts).                       Wash face,  Genitals (private parts) with your normal soap.             6.  Wash thoroughly, paying special attention to the area where your    surgery  will be performed.  7.  Thoroughly rinse your body with warm water from the neck down.  8.  DO NOT shower/wash with your normal soap after using and rinsing off the CHG Soap.                9.  Pat yourself dry with a clean towel.            10.  Wear clean pajamas.            11.  Place clean sheets on your bed the night of your first shower and do not  sleep with pets. Day of Surgery : Do not apply any lotions/deodorants the morning of surgery.  Please wear clean clothes to the hospital/surgery center.  FAILURE TO FOLLOW THESE INSTRUCTIONS MAY RESULT IN THE CANCELLATION OF YOUR SURGERY  PATIENT SIGNATURE_________________________________  NURSE SIGNATURE__________________________________  ________________________________________________________________________   Lawrence [name]  An incentive spirometer is a tool that can help keep your lungs clear and active. This tool measures how well you are filling your lungs with each breath. Taking long deep breaths may help reverse or decrease the chance of developing breathing  (pulmonary) problems (especially infection) following:  A long period of time when you are unable to move or be active. BEFORE THE PROCEDURE   If the spirometer includes an indicator to show your best effort, your nurse or respiratory therapist will set it to a desired goal.  If possible, sit up straight or lean slightly forward. Try not to slouch.  Hold the incentive spirometer in an upright position. INSTRUCTIONS FOR USE  1. Sit on the edge of your bed if possible, or sit up as far as you can in bed or on a chair. 2. Hold the incentive spirometer in an upright position. 3. Breathe out normally. 4. Place the mouthpiece in your mouth and seal your lips tightly around it. 5. Breathe in slowly and as deeply as possible, raising the piston or the ball toward the top of the column. 6. Hold your breath for 3-5 seconds or for as long as possible. Allow the piston or ball to fall to the bottom of the column. 7. Remove the mouthpiece from your mouth and breathe out normally. 8. Rest for a few seconds and repeat Steps 1 through 7 at least 10 times every 1-2 hours when you are awake. Take your time and take a few normal breaths between deep breaths. 9. The spirometer may include an indicator to show your best effort. Use the indicator as a goal to work toward during each repetition. 10. After each set of 10 deep breaths, practice coughing to be sure your lungs are clear. If you have an incision (the cut made at the time of surgery), support your incision when coughing by placing a pillow or rolled up towels firmly against it. Once you are able to get out of bed, walk around indoors and cough well. You may stop using the incentive spirometer when instructed by your caregiver.  RISKS AND COMPLICATIONS  Take your time  so you do not get dizzy or light-headed.  If you are in pain, you may need to take or ask for pain medication before doing incentive spirometry. It is harder to take a deep breath if you  are having pain. AFTER USE  Rest and breathe slowly and easily.  It can be helpful to keep track of a log of your progress. Your caregiver can provide you with a simple table to help with this. If you are using the spirometer at home, follow these instructions: Lawrence [name] IF:   You are having difficultly using the spirometer.  You have trouble using the spirometer as often as instructed.  Your pain medication is not giving enough relief while using the spirometer.  You develop fever of 100.5 F (38.1 C) or higher. SEEK IMMEDIATE MEDICAL CARE IF:   You cough up bloody sputum that had not been present before.  You develop fever of 102 F (38.9 C) or greater.  You develop worsening pain at or near the incision site. MAKE SURE YOU:   Understand these instructions.  Will watch your condition.  Will get help right away if you are not doing well or get worse. Document Released: 03/02/2007 Document Revised: 01/12/2012 Document Reviewed: 05/03/2007 Exit[employer] Patient Information 2014 Exit[employer], [place].   ________________________________________________________________________  WHAT IS A BLOOD TRANSFUSION? Blood Transfusion Information  A transfusion is the replacement of blood or some of its parts. Blood is made up of multiple cells which provide different functions.  Red blood cells carry oxygen and are used for blood loss replacement.  White blood cells fight against infection.  Platelets control bleeding.  Plasma helps clot blood.  Other blood products are available for specialized needs, such as hemophilia or other clotting disorders. BEFORE THE TRANSFUSION  Who gives blood for transfusions?   Healthy volunteers who are fully evaluated to make sure their blood is safe. This is blood bank blood. Transfusion therapy is the safest it has ever been in the practice of medicine. Before blood is taken from a donor, a complete history is taken to make sure that person has  no history of diseases nor engages in risky social behavior (examples are intravenous drug use or sexual activity with multiple partners). The donor's travel history is screened to minimize risk of transmitting infections, such as malaria. The donated blood is tested for signs of infectious diseases, such as HIV and hepatitis. The blood is then tested to be sure it is compatible with you in order to minimize the chance of a transfusion reaction. If you or a relative donates blood, this is often done in anticipation of surgery and is not appropriate for emergency situations. It takes many days to process the donated blood. RISKS AND COMPLICATIONS Although transfusion therapy is very safe and saves many lives, the main dangers of transfusion include:   Getting an infectious disease.  Developing a transfusion reaction. This is an allergic reaction to something in the blood you were given. Every precaution is taken to prevent this. The decision to have a blood transfusion has been considered carefully by your caregiver before blood is given. Blood is not given unless the benefits outweigh the risks. AFTER THE TRANSFUSION  Right after receiving a blood transfusion, you will usually feel much better and more energetic. This is especially true if your red blood cells have gotten low (anemic). The transfusion raises the level of the red blood cells which carry oxygen, and this usually causes an energy increase.  The  nurse administering the transfusion will monitor you carefully for complications. HOME CARE INSTRUCTIONS  No special instructions are needed after a transfusion. You may find your energy is better. Speak with your caregiver about any limitations on activity for underlying diseases you may have. SEEK MEDICAL CARE IF:   Your condition is not improving after your transfusion.  You develop redness or irritation at the intravenous (IV) site. SEEK IMMEDIATE MEDICAL CARE IF:  Any of the following  symptoms occur over the next 12 hours:  Shaking chills.  You have a temperature by mouth above 102 F (38.9 C), not controlled by medicine.  Chest, back, or muscle pain.  People around you feel you are not acting correctly or are confused.  Shortness of breath or difficulty breathing.  Dizziness and fainting.  You get a rash or develop hives.  You have a decrease in urine output.  Your urine turns a dark color or changes to pink, red, or brown. Any of the following symptoms occur over the next 10 days:  You have a temperature by mouth above 102 F (38.9 C), not controlled by medicine.  Shortness of breath.  Weakness after normal activity.  The white part of the eye turns yellow (jaundice).  You have a decrease in the amount of urine or are urinating less often.  Your urine turns a dark color or changes to pink, red, or brown. Document Released: 10/17/2000 Document Revised: 01/12/2012 Document Reviewed: 06/05/2008 [facility] Patient Information 2014 [place], [place].  _______________________________________________________________________

## 2017-10-16 NOTE — Pre-Procedure Instructions (Signed)
Hgb A 1 C 5.5 in chart from last office visit note from [facility] 08/15/17

## 2017-10-19 ENCOUNTER — Encounter (HOSPITAL_COMMUNITY)
Admission: RE | Admit: 2017-10-19 | Discharge: 2017-10-19 | Disposition: A | Discharge status: Home / Self Care | Payer: BLUE CROSS/BLUE SHIELD | Source: Ambulatory Visit | Attending: Specialist | Admitting: Specialist

## 2017-10-19 ENCOUNTER — Other Ambulatory Visit: Payer: Self-pay

## 2017-10-19 ENCOUNTER — Encounter (HOSPITAL_COMMUNITY): Payer: Self-pay

## 2017-10-19 DIAGNOSIS — M1711 Unilateral primary osteoarthritis, right knee: Secondary | ICD-10-CM

## 2017-10-19 DIAGNOSIS — Z01812 Encounter for preprocedural laboratory examination: Secondary | ICD-10-CM | POA: Insufficient documentation

## 2017-10-19 DIAGNOSIS — Z0183 Encounter for blood typing: Secondary | ICD-10-CM

## 2017-10-19 HISTORY — DX: Disorder of kidney and ureter, unspecified: N28.9

## 2017-10-19 HISTORY — DX: Unspecified cataract: H26.9

## 2017-10-19 HISTORY — DX: Polyp of colon: K63.5

## 2017-10-19 HISTORY — DX: Unspecified convulsions: R56.9

## 2017-10-19 HISTORY — DX: Deficiency of other specified B group vitamins: E53.8

## 2017-10-19 HISTORY — DX: Asymptomatic varicose veins of bilateral lower extremities: I83.93

## 2017-10-19 HISTORY — DX: Hyperlipidemia, unspecified: E78.5

## 2017-10-19 HISTORY — DX: Cellulitis, unspecified: L03.90

## 2017-10-19 HISTORY — DX: Rhabdomyolysis: M62.82

## 2017-10-19 HISTORY — DX: Cyst of kidney, acquired: N28.1

## 2017-10-19 LAB — URINALYSIS, ROUTINE W REFLEX MICROSCOPIC
Bilirubin Urine: NEGATIVE
GLUCOSE, UA: NEGATIVE mg/dL
Hgb urine dipstick: NEGATIVE
KETONES UR: NEGATIVE mg/dL
LEUKOCYTES UA: NEGATIVE
Nitrite: NEGATIVE
PROTEIN: NEGATIVE mg/dL
Specific Gravity, Urine: 1.019 (ref 1.005–1.030)
pH: 5 (ref 5.0–8.0)

## 2017-10-19 LAB — CBC
HCT: 45.3 % (ref 39.0–52.0)
Hemoglobin: 15.4 g/dL (ref 13.0–17.0)
MCH: 29.8 pg (ref 26.0–34.0)
MCHC: 34 g/dL (ref 30.0–36.0)
MCV: 87.6 fL (ref 78.0–100.0)
Platelets: 204 10*3/uL (ref 150–400)
RBC: 5.17 MIL/uL (ref 4.22–5.81)
RDW: 13.4 % (ref 11.5–15.5)
WBC: 7.9 10*3/uL (ref 4.0–10.5)

## 2017-10-19 LAB — BASIC METABOLIC PANEL
ANION GAP: 7 (ref 5–15)
BUN: 22 mg/dL — ABNORMAL HIGH (ref 6–20)
CALCIUM: 9.1 mg/dL (ref 8.9–10.3)
CO2: 25 mmol/L (ref 22–32)
Chloride: 107 mmol/L (ref 101–111)
Creatinine, Ser: 1.18 mg/dL (ref 0.61–1.24)
GLUCOSE: 100 mg/dL — AB (ref 65–99)
Potassium: 4 mmol/L (ref 3.5–5.1)
SODIUM: 139 mmol/L (ref 135–145)

## 2017-10-19 LAB — SURGICAL PCR SCREEN
MRSA, PCR: NEGATIVE
Staphylococcus aureus: NEGATIVE

## 2017-10-19 LAB — GLUCOSE, CAPILLARY: GLUCOSE-CAPILLARY: 99 mg/dL (ref 65–99)

## 2017-10-19 LAB — PROTIME-INR
INR: 1.06
PROTHROMBIN TIME: 13.7 s (ref 11.4–15.2)

## 2017-10-19 LAB — APTT: aPTT: 28 seconds (ref 24–36)

## 2017-10-19 NOTE — Pre-Procedure Instructions (Signed)
BMP results from 10/19/17 faxed to Dr. Tonita Cong via epic.

## 2017-10-21 MED ORDER — DEXTROSE 5 % IV SOLN
3.0000 g | INTRAVENOUS | Status: AC
  Administered 2017-10-22: 3 g via INTRAVENOUS
  Filled 2017-10-21: qty 3

## 2017-10-21 NOTE — Anesthesia Preprocedure Evaluation (Addendum)
Anesthesia Evaluation  Patient identified by MRN, date of birth, ID band Patient awake    Reviewed: Allergy & Precautions, H&P , Patient's Chart, lab work & pertinent test results, reviewed documented beta blocker date and time   Airway Mallampati: II  TM Distance: >3 FB Neck ROM: full    Dental no notable dental hx.    Pulmonary sleep apnea , former smoker,    Pulmonary exam normal breath sounds clear to auscultation       Cardiovascular hypertension,  Rhythm:regular Rate:Normal     Neuro/Psych    GI/Hepatic   Endo/Other  diabetes  Renal/GU      Musculoskeletal   Abdominal   Peds  Hematology   Anesthesia Other Findings   Reproductive/Obstetrics                             Anesthesia Physical Anesthesia Plan  ASA: III  Anesthesia Plan: Spinal   Post-op Pain Management:    Induction:   PONV Risk Score and Plan: 2 and Ondansetron and Treatment may vary due to age or medical condition  Airway Management Planned:   Additional Equipment:   Intra-op Plan:   Post-operative Plan:   Informed Consent: I have reviewed the patients History and Physical, chart, labs and discussed the procedure including the risks, benefits and alternatives for the proposed anesthesia with the patient or authorized representative who has indicated his/her understanding and acceptance.   Dental Advisory Given  Plan Discussed with: CRNA and Surgeon  Anesthesia Plan Comments: (  )        Anesthesia Quick Evaluation

## 2017-10-21 NOTE — Pre-Procedure Instructions (Signed)
Requested EKG tracing that is located under care everywhere at [facility] 06/29/17 on Monday, Dec. 17th and again on Wednesday Dec. 19, 2018.  I faxed request on Wednesday, Dec. 19, 2018 after speaking with someone in medical records this morning about EKG. I called back again this afternoon after being on hold for 20 minutes I was disconnected.  I called Dr. Howell Rucks reported to him the description of the EKG in Care Everywhere.  Dr. Eligha Bridegroom states we will not need to EKG tracing in order to proceed with Mr. Throgmorton surgery 10/22/17.

## 2017-10-22 ENCOUNTER — Inpatient Hospital Stay (HOSPITAL_COMMUNITY): Payer: BLUE CROSS/BLUE SHIELD | Admitting: Anesthesiology

## 2017-10-22 ENCOUNTER — Inpatient Hospital Stay (HOSPITAL_COMMUNITY)
Admission: RE | Admit: 2017-10-22 | Discharge: 2017-10-24 | DRG: 470 | Disposition: A | Discharge status: Home / Self Care | Payer: BLUE CROSS/BLUE SHIELD | Source: Ambulatory Visit | Attending: Specialist | Admitting: Specialist

## 2017-10-22 ENCOUNTER — Encounter (HOSPITAL_COMMUNITY)
Admission: RE | Disposition: A | Discharge status: Home / Self Care | Payer: Self-pay | Source: Ambulatory Visit | Attending: Specialist

## 2017-10-22 ENCOUNTER — Encounter (HOSPITAL_COMMUNITY): Payer: Self-pay | Admitting: Emergency Medicine

## 2017-10-22 ENCOUNTER — Other Ambulatory Visit: Payer: Self-pay

## 2017-10-22 ENCOUNTER — Inpatient Hospital Stay (HOSPITAL_COMMUNITY): Payer: BLUE CROSS/BLUE SHIELD

## 2017-10-22 DIAGNOSIS — M1711 Unilateral primary osteoarthritis, right knee: Secondary | ICD-10-CM | POA: Diagnosis present

## 2017-10-22 DIAGNOSIS — M6588 Other synovitis and tenosynovitis, other site: Secondary | ICD-10-CM | POA: Diagnosis present

## 2017-10-22 DIAGNOSIS — E1122 Type 2 diabetes mellitus with diabetic chronic kidney disease: Secondary | ICD-10-CM | POA: Diagnosis present

## 2017-10-22 DIAGNOSIS — Z79899 Other long term (current) drug therapy: Secondary | ICD-10-CM | POA: Diagnosis not present

## 2017-10-22 DIAGNOSIS — Z87891 Personal history of nicotine dependence: Secondary | ICD-10-CM | POA: Diagnosis not present

## 2017-10-22 DIAGNOSIS — I129 Hypertensive chronic kidney disease with stage 1 through stage 4 chronic kidney disease, or unspecified chronic kidney disease: Secondary | ICD-10-CM | POA: Diagnosis present

## 2017-10-22 DIAGNOSIS — Z833 Family history of diabetes mellitus: Secondary | ICD-10-CM

## 2017-10-22 DIAGNOSIS — E1136 Type 2 diabetes mellitus with diabetic cataract: Secondary | ICD-10-CM | POA: Diagnosis present

## 2017-10-22 DIAGNOSIS — E785 Hyperlipidemia, unspecified: Secondary | ICD-10-CM | POA: Diagnosis present

## 2017-10-22 DIAGNOSIS — M25761 Osteophyte, right knee: Secondary | ICD-10-CM | POA: Diagnosis present

## 2017-10-22 DIAGNOSIS — Z7984 Long term (current) use of oral hypoglycemic drugs: Secondary | ICD-10-CM

## 2017-10-22 DIAGNOSIS — M25561 Pain in right knee: Secondary | ICD-10-CM | POA: Diagnosis present

## 2017-10-22 DIAGNOSIS — Z96659 Presence of unspecified artificial knee joint: Secondary | ICD-10-CM

## 2017-10-22 DIAGNOSIS — M170 Bilateral primary osteoarthritis of knee: Principal | ICD-10-CM | POA: Diagnosis present

## 2017-10-22 DIAGNOSIS — G4730 Sleep apnea, unspecified: Secondary | ICD-10-CM | POA: Diagnosis present

## 2017-10-22 DIAGNOSIS — N183 Chronic kidney disease, stage 3 (moderate): Secondary | ICD-10-CM | POA: Diagnosis present

## 2017-10-22 DIAGNOSIS — Z85528 Personal history of other malignant neoplasm of kidney: Secondary | ICD-10-CM | POA: Diagnosis not present

## 2017-10-22 DIAGNOSIS — M21161 Varus deformity, not elsewhere classified, right knee: Secondary | ICD-10-CM | POA: Diagnosis present

## 2017-10-22 HISTORY — PX: TOTAL KNEE ARTHROPLASTY: SHX125

## 2017-10-22 LAB — GLUCOSE, CAPILLARY
GLUCOSE-CAPILLARY: 104 mg/dL — AB (ref 65–99)
Glucose-Capillary: 133 mg/dL — ABNORMAL HIGH (ref 65–99)
Glucose-Capillary: 146 mg/dL — ABNORMAL HIGH (ref 65–99)
Glucose-Capillary: 140 mg/dL — ABNORMAL HIGH (ref 65–99)

## 2017-10-22 LAB — TYPE AND SCREEN
ABO/RH(D): O POS
Antibody Screen: NEGATIVE

## 2017-10-22 SURGERY — ARTHROPLASTY, KNEE, TOTAL
Anesthesia: Spinal | Site: Knee | Laterality: Right

## 2017-10-22 MED ORDER — FENTANYL CITRATE (PF) 100 MCG/2ML IJ SOLN
INTRAMUSCULAR | Status: AC
  Filled 2017-10-22: qty 2

## 2017-10-22 MED ORDER — HYDROMORPHONE HCL 1 MG/ML IJ SOLN
1.0000 mg | INTRAMUSCULAR | Status: DC | PRN
  Administered 2017-10-22 – 2017-10-23 (×5): 1 mg via INTRAVENOUS
  Filled 2017-10-22 (×5): qty 1

## 2017-10-22 MED ORDER — FENTANYL CITRATE (PF) 100 MCG/2ML IJ SOLN
INTRAMUSCULAR | Status: DC
Start: 2017-10-22 — End: 2017-10-22
  Filled 2017-10-22: qty 4

## 2017-10-22 MED ORDER — CLINDAMYCIN PHOSPHATE 900 MG/50ML IV SOLN
INTRAVENOUS | Status: AC
  Filled 2017-10-22: qty 50

## 2017-10-22 MED ORDER — BUPIVACAINE-EPINEPHRINE (PF) 0.5% -1:200000 IJ SOLN
INTRAMUSCULAR | Status: DC | PRN
  Administered 2017-10-22: 25 mL via PERINEURAL

## 2017-10-22 MED ORDER — CLINDAMYCIN PHOSPHATE 900 MG/50ML IV SOLN: INTRAVENOUS | Status: DC | PRN

## 2017-10-22 MED ORDER — POLYETHYLENE GLYCOL 3350 17 G PO PACK
17.0000 g | PACK | Freq: Every day | ORAL | Status: DC | PRN
  Administered 2017-10-23: 17 g via ORAL
  Filled 2017-10-22: qty 1

## 2017-10-22 MED ORDER — SODIUM CHLORIDE 0.9 % IV SOLN
INTRAVENOUS | Status: DC | PRN
  Administered 2017-10-22: 500 mL

## 2017-10-22 MED ORDER — AMLODIPINE BESYLATE 5 MG PO TABS
5.0000 mg | ORAL_TABLET | Freq: Every day | ORAL | Status: DC
  Administered 2017-10-24: 5 mg via ORAL
  Filled 2017-10-22: qty 1

## 2017-10-22 MED ORDER — MIDAZOLAM HCL 2 MG/2ML IJ SOLN
INTRAMUSCULAR | Status: AC
  Filled 2017-10-22: qty 2

## 2017-10-22 MED ORDER — FENTANYL CITRATE (PF) 100 MCG/2ML IJ SOLN
INTRAMUSCULAR | Status: DC | PRN
  Administered 2017-10-22: 100 ug via INTRAVENOUS

## 2017-10-22 MED ORDER — METHOCARBAMOL 500 MG PO TABS: 500.0000 mg | ORAL_TABLET | Freq: Four times a day (QID) | ORAL | 1 refills | Status: DC | PRN

## 2017-10-22 MED ORDER — MIDAZOLAM HCL 5 MG/5ML IJ SOLN
INTRAMUSCULAR | Status: DC | PRN
  Administered 2017-10-22: 2 mg via INTRAVENOUS

## 2017-10-22 MED ORDER — BISACODYL 5 MG PO TBEC: 5.0000 mg | DELAYED_RELEASE_TABLET | Freq: Every day | ORAL | Status: DC | PRN

## 2017-10-22 MED ORDER — ONDANSETRON HCL 4 MG/2ML IJ SOLN
INTRAMUSCULAR | Status: AC
  Filled 2017-10-22: qty 2

## 2017-10-22 MED ORDER — METHOCARBAMOL 1000 MG/10ML IJ SOLN
500.0000 mg | Freq: Four times a day (QID) | INTRAVENOUS | Status: DC | PRN
  Administered 2017-10-22: 13:00:00 500 mg via INTRAVENOUS
  Filled 2017-10-22: qty 550

## 2017-10-22 MED ORDER — ONDANSETRON HCL 4 MG/2ML IJ SOLN: 4.0000 mg | Freq: Four times a day (QID) | INTRAMUSCULAR | Status: DC | PRN

## 2017-10-22 MED ORDER — ASPIRIN EC 325 MG PO TBEC: 325.0000 mg | DELAYED_RELEASE_TABLET | Freq: Two times a day (BID) | ORAL | 1 refills | Status: DC

## 2017-10-22 MED ORDER — ONDANSETRON HCL 4 MG/2ML IJ SOLN
INTRAMUSCULAR | Status: DC | PRN
  Administered 2017-10-22: 4 mg via INTRAVENOUS

## 2017-10-22 MED ORDER — CLINDAMYCIN PHOSPHATE 900 MG/50ML IV SOLN
900.0000 mg | Freq: Once | INTRAVENOUS | Status: AC
Start: 2017-10-22 — End: 2017-10-22
  Administered 2017-10-22: 900 mg via INTRAVENOUS

## 2017-10-22 MED ORDER — RISAQUAD PO CAPS
1.0000 | ORAL_CAPSULE | Freq: Every day | ORAL | Status: DC
  Administered 2017-10-22 – 2017-10-24 (×3): 1 via ORAL
  Filled 2017-10-22 (×3): qty 1

## 2017-10-22 MED ORDER — PROPOFOL 10 MG/ML IV BOLUS
INTRAVENOUS | Status: AC
  Filled 2017-10-22: qty 40

## 2017-10-22 MED ORDER — ACETAMINOPHEN 10 MG/ML IV SOLN
1000.0000 mg | INTRAVENOUS | Status: AC
  Administered 2017-10-22: 1000 mg via INTRAVENOUS

## 2017-10-22 MED ORDER — ADULT MULTIVITAMIN W/MINERALS CH
1.0000 | ORAL_TABLET | Freq: Every day | ORAL | Status: DC
  Administered 2017-10-23 – 2017-10-24 (×2): 1 via ORAL
  Filled 2017-10-22 (×2): qty 1

## 2017-10-22 MED ORDER — FENOFIBRATE 160 MG PO TABS
160.0000 mg | ORAL_TABLET | Freq: Every day | ORAL | Status: DC
  Administered 2017-10-23 – 2017-10-24 (×2): 160 mg via ORAL
  Filled 2017-10-22 (×2): qty 1

## 2017-10-22 MED ORDER — SODIUM CHLORIDE 0.9 % IR SOLN
Status: DC | PRN
  Administered 2017-10-22: 3000 mL

## 2017-10-22 MED ORDER — MENTHOL 3 MG MT LOZG: 1.0000 | LOZENGE | OROMUCOSAL | Status: DC | PRN

## 2017-10-22 MED ORDER — LEVETIRACETAM ER 750 MG PO TB24: 3000.0000 mg | ORAL_TABLET | Freq: Every day | ORAL | Status: DC

## 2017-10-22 MED ORDER — ALUM & MAG HYDROXIDE-SIMETH 200-200-20 MG/5ML PO SUSP: 30.0000 mL | ORAL | Status: DC | PRN

## 2017-10-22 MED ORDER — LEVETIRACETAM ER 500 MG PO TB24
3000.0000 mg | ORAL_TABLET | Freq: Every day | ORAL | Status: DC
  Administered 2017-10-23 – 2017-10-24 (×2): 3000 mg via ORAL
  Filled 2017-10-22 (×2): qty 6

## 2017-10-22 MED ORDER — OXYCODONE HCL 5 MG PO TABS: 5.0000 mg | ORAL_TABLET | ORAL | Status: DC | PRN

## 2017-10-22 MED ORDER — PROPOFOL 10 MG/ML IV BOLUS
INTRAVENOUS | Status: AC
  Filled 2017-10-22: qty 20

## 2017-10-22 MED ORDER — POTASSIUM CHLORIDE IN NACL 20-0.45 MEQ/L-% IV SOLN
INTRAVENOUS | Status: AC
  Administered 2017-10-22 – 2017-10-23 (×2): via INTRAVENOUS
  Filled 2017-10-22 (×2): qty 1000

## 2017-10-22 MED ORDER — ONDANSETRON HCL 4 MG PO TABS
4.0000 mg | ORAL_TABLET | Freq: Four times a day (QID) | ORAL | Status: DC | PRN
Start: 2017-10-22 — End: 2017-10-24

## 2017-10-22 MED ORDER — FERROUS SULFATE 325 (65 FE) MG PO TABS
325.0000 mg | ORAL_TABLET | Freq: Every day | ORAL | Status: DC
  Administered 2017-10-23 – 2017-10-24 (×2): 325 mg via ORAL
  Filled 2017-10-22 (×2): qty 1

## 2017-10-22 MED ORDER — METOCLOPRAMIDE HCL 5 MG PO TABS: 5.0000 mg | ORAL_TABLET | Freq: Three times a day (TID) | ORAL | Status: DC | PRN

## 2017-10-22 MED ORDER — MAGNESIUM CITRATE PO SOLN: 1.0000 | Freq: Once | ORAL | Status: DC | PRN

## 2017-10-22 MED ORDER — TRANEXAMIC ACID 1000 MG/10ML IV SOLN
1000.0000 mg | INTRAVENOUS | Status: AC
  Administered 2017-10-22: 1000 mg via INTRAVENOUS
  Filled 2017-10-22: qty 1100

## 2017-10-22 MED ORDER — VITAMIN B-12 100 MCG PO TABS
100.0000 ug | ORAL_TABLET | Freq: Every day | ORAL | Status: DC
  Administered 2017-10-22 – 2017-10-24 (×3): 100 ug via ORAL
  Filled 2017-10-22 (×3): qty 1

## 2017-10-22 MED ORDER — OXYCODONE-ACETAMINOPHEN 10-325 MG PO TABS: 1.0000 | ORAL_TABLET | ORAL | 0 refills | Status: DC | PRN

## 2017-10-22 MED ORDER — METOPROLOL SUCCINATE ER 50 MG PO TB24
50.0000 mg | ORAL_TABLET | Freq: Every day | ORAL | Status: DC
  Administered 2017-10-23 – 2017-10-24 (×2): 50 mg via ORAL
  Filled 2017-10-22 (×2): qty 1

## 2017-10-22 MED ORDER — DEXAMETHASONE SODIUM PHOSPHATE 10 MG/ML IJ SOLN
INTRAMUSCULAR | Status: DC | PRN
  Administered 2017-10-22: 4 mg via INTRAVENOUS

## 2017-10-22 MED ORDER — METOPROLOL SUCCINATE ER 50 MG PO TB24: 50.0000 mg | ORAL_TABLET | Freq: Every morning | ORAL | Status: DC

## 2017-10-22 MED ORDER — VITAMIN C 500 MG PO TABS
500.0000 mg | ORAL_TABLET | Freq: Every day | ORAL | Status: DC
  Administered 2017-10-22 – 2017-10-24 (×3): 500 mg via ORAL
  Filled 2017-10-22 (×3): qty 1

## 2017-10-22 MED ORDER — DOCUSATE SODIUM 100 MG PO CAPS: 100.0000 mg | ORAL_CAPSULE | Freq: Two times a day (BID) | ORAL | 2 refills | Status: DC

## 2017-10-22 MED ORDER — TESTOSTERONE 50 MG/5GM (1%) TD GEL
5.0000 g | Freq: Every day | TRANSDERMAL | Status: DC
  Administered 2017-10-24: 09:00:00 5 g via TRANSDERMAL
  Filled 2017-10-22: qty 5

## 2017-10-22 MED ORDER — BUPIVACAINE-EPINEPHRINE 0.25% -1:200000 IJ SOLN
INTRAMUSCULAR | Status: DC | PRN
  Administered 2017-10-22: 40 mL

## 2017-10-22 MED ORDER — SODIUM CHLORIDE 0.9 % IV SOLN
INTRAVENOUS | Status: AC
  Filled 2017-10-22: qty 500000

## 2017-10-22 MED ORDER — INSULIN ASPART 100 UNIT/ML ~~LOC~~ SOLN
0.0000 [IU] | Freq: Three times a day (TID) | SUBCUTANEOUS | Status: DC
  Administered 2017-10-22: 18:00:00 2 [IU] via SUBCUTANEOUS

## 2017-10-22 MED ORDER — DOCUSATE SODIUM 100 MG PO CAPS
100.0000 mg | ORAL_CAPSULE | Freq: Two times a day (BID) | ORAL | Status: DC
  Administered 2017-10-22 – 2017-10-24 (×4): 100 mg via ORAL
  Filled 2017-10-22 (×4): qty 1

## 2017-10-22 MED ORDER — METHOCARBAMOL 500 MG PO TABS
500.0000 mg | ORAL_TABLET | Freq: Four times a day (QID) | ORAL | Status: DC | PRN
  Administered 2017-10-22 – 2017-10-24 (×5): 500 mg via ORAL
  Filled 2017-10-22 (×6): qty 1

## 2017-10-22 MED ORDER — TESTOSTERONE 20.25 MG/ACT (1.62%) TD GEL
81.0000 mg | Freq: Every day | TRANSDERMAL | Status: DC
Start: 2017-10-22 — End: 2017-10-22

## 2017-10-22 MED ORDER — STERILE WATER FOR IRRIGATION IR SOLN
Status: DC | PRN
  Administered 2017-10-22: 2000 mL

## 2017-10-22 MED ORDER — ACETAMINOPHEN 10 MG/ML IV SOLN
INTRAVENOUS | Status: AC
  Filled 2017-10-22: qty 100

## 2017-10-22 MED ORDER — LACTATED RINGERS IV SOLN
INTRAVENOUS | Status: DC
  Administered 2017-10-22 (×2): via INTRAVENOUS

## 2017-10-22 MED ORDER — PHENOL 1.4 % MT LIQD
1.0000 | OROMUCOSAL | Status: DC | PRN
Start: 2017-10-22 — End: 2017-10-24

## 2017-10-22 MED ORDER — POLYETHYLENE GLYCOL 3350 17 G PO PACK: 17.0000 g | PACK | Freq: Every day | ORAL | 0 refills | Status: DC

## 2017-10-22 MED ORDER — OXYCODONE HCL 5 MG PO TABS
10.0000 mg | ORAL_TABLET | ORAL | Status: DC | PRN
  Administered 2017-10-22 (×3): 10 mg via ORAL
  Administered 2017-10-23 (×3): 15 mg via ORAL
  Administered 2017-10-23 (×2): 10 mg via ORAL
  Administered 2017-10-23 – 2017-10-24 (×5): 15 mg via ORAL
  Filled 2017-10-22 (×3): qty 3
  Filled 2017-10-22: qty 2
  Filled 2017-10-22 (×5): qty 3
  Filled 2017-10-22 (×2): qty 2
  Filled 2017-10-22: qty 3
  Filled 2017-10-22: qty 2

## 2017-10-22 MED ORDER — ACETAMINOPHEN 325 MG PO TABS
650.0000 mg | ORAL_TABLET | ORAL | Status: DC | PRN
  Administered 2017-10-22 – 2017-10-24 (×6): 650 mg via ORAL
  Filled 2017-10-22 (×6): qty 2

## 2017-10-22 MED ORDER — DIPHENHYDRAMINE HCL 12.5 MG/5ML PO ELIX: 12.5000 mg | ORAL_SOLUTION | ORAL | Status: DC | PRN

## 2017-10-22 MED ORDER — DEXTROSE 5 % IV SOLN
3.0000 g | Freq: Four times a day (QID) | INTRAVENOUS | Status: AC
  Administered 2017-10-22 – 2017-10-23 (×3): 3 g via INTRAVENOUS
  Filled 2017-10-22 (×3): qty 3

## 2017-10-22 MED ORDER — ACETAMINOPHEN 650 MG RE SUPP: 650.0000 mg | RECTAL | Status: DC | PRN

## 2017-10-22 MED ORDER — BUPIVACAINE-EPINEPHRINE 0.25% -1:200000 IJ SOLN
INTRAMUSCULAR | Status: AC
  Filled 2017-10-22: qty 1

## 2017-10-22 MED ORDER — PROPOFOL 500 MG/50ML IV EMUL
INTRAVENOUS | Status: DC | PRN
  Administered 2017-10-22: 25 ug/kg/min via INTRAVENOUS

## 2017-10-22 MED ORDER — METOCLOPRAMIDE HCL 5 MG/ML IJ SOLN: 5.0000 mg | Freq: Three times a day (TID) | INTRAMUSCULAR | Status: DC | PRN

## 2017-10-22 MED ORDER — FENTANYL CITRATE (PF) 100 MCG/2ML IJ SOLN: 25.0000 ug | INTRAMUSCULAR | Status: DC | PRN

## 2017-10-22 MED ORDER — DEXAMETHASONE SODIUM PHOSPHATE 10 MG/ML IJ SOLN
INTRAMUSCULAR | Status: AC
  Filled 2017-10-22: qty 1

## 2017-10-22 MED ORDER — ASPIRIN EC 325 MG PO TBEC
325.0000 mg | DELAYED_RELEASE_TABLET | Freq: Two times a day (BID) | ORAL | Status: DC
  Administered 2017-10-23 – 2017-10-24 (×3): 325 mg via ORAL
  Filled 2017-10-22 (×3): qty 1

## 2017-10-22 SURGICAL SUPPLY — 59 items
BAG ZIPLOCK 12X15 (MISCELLANEOUS) ×3 IMPLANT
BANDAGE ACE 4X5 VEL STRL LF (GAUZE/BANDAGES/DRESSINGS) ×3 IMPLANT
BANDAGE ACE 6X5 VEL STRL LF (GAUZE/BANDAGES/DRESSINGS) ×3 IMPLANT
BLADE SAG 18X100X1.27 (BLADE) ×3 IMPLANT
BLADE SAW SGTL 11.0X1.19X90.0M (BLADE) ×3 IMPLANT
BLADE SAW SGTL 13.0X1.19X90.0M (BLADE) ×3 IMPLANT
CAPT KNEE TOTAL 3 ATTUNE ×3 IMPLANT
CEMENT HV SMART SET (Cement) ×9 IMPLANT
CLOTH 2% CHLOROHEXIDINE 3PK (PERSONAL CARE ITEMS) ×3 IMPLANT
COVER SURGICAL LIGHT HANDLE (MISCELLANEOUS) ×3 IMPLANT
CUFF TOURN SGL QUICK 34 (TOURNIQUET CUFF) ×2
CUFF TRNQT CYL 34X4X40X1 (TOURNIQUET CUFF) ×1 IMPLANT
DECANTER SPIKE VIAL GLASS SM (MISCELLANEOUS) ×3 IMPLANT
DRAPE INCISE IOBAN 66X45 STRL (DRAPES) ×3 IMPLANT
DRAPE ORTHO SPLIT 77X108 STRL (DRAPES) ×4
DRAPE SHEET LG 3/4 BI-LAMINATE (DRAPES) ×3 IMPLANT
DRAPE SURG ORHT 6 SPLT 77X108 (DRAPES) ×2 IMPLANT
DRAPE U-SHAPE 47X51 STRL (DRAPES) ×3 IMPLANT
DRESSING AQUACEL AG SP 3.5X10 (GAUZE/BANDAGES/DRESSINGS) ×1 IMPLANT
DRSG AQUACEL AG SP 3.5X10 (GAUZE/BANDAGES/DRESSINGS) ×3
DURAPREP 26ML APPLICATOR (WOUND CARE) ×3 IMPLANT
ELECT REM PT RETURN 15FT ADLT (MISCELLANEOUS) ×3 IMPLANT
EVACUATOR 1/8 PVC DRAIN (DRAIN) IMPLANT
GLOVE BIOGEL PI IND STRL 7.0 (GLOVE) ×1 IMPLANT
GLOVE BIOGEL PI IND STRL 7.5 (GLOVE) ×3 IMPLANT
GLOVE BIOGEL PI IND STRL 8 (GLOVE) ×1 IMPLANT
GLOVE BIOGEL PI INDICATOR 7.0 (GLOVE) ×2
GLOVE BIOGEL PI INDICATOR 7.5 (GLOVE) ×6
GLOVE BIOGEL PI INDICATOR 8 (GLOVE) ×2
GLOVE ECLIPSE 6.5 STRL STRAW (GLOVE) ×3 IMPLANT
GLOVE INDICATOR 6.5 STRL GRN (GLOVE) ×3 IMPLANT
GLOVE SURG SS PI 7.0 STRL IVOR (GLOVE) ×3 IMPLANT
GLOVE SURG SS PI 7.5 STRL IVOR (GLOVE) ×6 IMPLANT
GLOVE SURG SS PI 8.0 STRL IVOR (GLOVE) ×6 IMPLANT
GOWN STRL REUS W/ TWL XL LVL3 (GOWN DISPOSABLE) ×1 IMPLANT
GOWN STRL REUS W/TWL XL LVL3 (GOWN DISPOSABLE) ×11 IMPLANT
HANDPIECE INTERPULSE COAX TIP (DISPOSABLE) ×2
HEMOSTAT SPONGE AVITENE ULTRA (HEMOSTASIS) ×3 IMPLANT
IMMOBILIZER KNEE 20 (SOFTGOODS) ×3
IMMOBILIZER KNEE 20 THIGH 36 (SOFTGOODS) ×1 IMPLANT
MANIFOLD NEPTUNE II (INSTRUMENTS) ×3 IMPLANT
PACK TOTAL KNEE CUSTOM (KITS) ×3 IMPLANT
POSITIONER SURGICAL ARM (MISCELLANEOUS) ×3 IMPLANT
SEALER BIPOLAR AQUA 6.0 (INSTRUMENTS) ×3 IMPLANT
SET HNDPC FAN SPRY TIP SCT (DISPOSABLE) ×1 IMPLANT
STAPLER VISISTAT (STAPLE) ×3 IMPLANT
SUT BONE WAX W31G (SUTURE) ×3 IMPLANT
SUT MNCRL AB 4-0 PS2 18 (SUTURE) IMPLANT
SUT STRATAFIX 0 PDS 27 VIOLET (SUTURE) ×3
SUT VIC AB 1 CT1 27 (SUTURE) ×4
SUT VIC AB 1 CT1 27XBRD ANTBC (SUTURE) ×2 IMPLANT
SUT VIC AB 2-0 CT1 27 (SUTURE) ×6
SUT VIC AB 2-0 CT1 TAPERPNT 27 (SUTURE) ×3 IMPLANT
SUTURE STRATFX 0 PDS 27 VIOLET (SUTURE) ×1 IMPLANT
SYR 50ML LL SCALE MARK (SYRINGE) IMPLANT
TOWER CARTRIDGE SMART MIX (DISPOSABLE) ×3 IMPLANT
TRAY FOLEY W/METER SILVER 16FR (SET/KITS/TRAYS/PACK) ×3 IMPLANT
WRAP KNEE MAXI GEL POST OP (GAUZE/BANDAGES/DRESSINGS) ×3 IMPLANT
YANKAUER SUCT BULB TIP 10FT TU (MISCELLANEOUS) ×3 IMPLANT

## 2017-10-22 NOTE — Brief Op Note (Signed)
10/22/2017  9:55 AM  PATIENT:  Lawrence [name]  61 y.o. male  PRE-OPERATIVE DIAGNOSIS:  Degenerative joint disease right knee  POST-OPERATIVE DIAGNOSIS:  Degenerative joint disease right knee  PROCEDURE:  Procedure(s) with comments: RIGHT TOTAL KNEE ARTHROPLASTY (Right) - Adductor Block  SURGEON:  Surgeon(s) and Role:    Susa Day, MD - Primary  PHYSICIAN ASSISTANT:   ASSISTANTS: Bissell   ANESTHESIA:   general  EBL:  minimal   BLOOD ADMINISTERED:none  DRAINS: none   LOCAL MEDICATIONS USED:  MARCAINE     SPECIMEN:  No Specimen  DISPOSITION OF SPECIMEN:  N/A  COUNTS:  YES  TOURNIQUET:   Total Tourniquet Time Documented: Thigh (Right) - 80 minutes Total: Thigh (Right) - 80 minutes   DICTATION: .Other Dictation: Dictation Number  [id number]  PLAN OF CARE: Admit to inpatient   PATIENT DISPOSITION:  PACU - hemodynamically stable.   Delay start of Pharmacological VTE agent (>24hrs) due to surgical blood loss or risk of bleeding: no

## 2017-10-22 NOTE — Evaluation (Signed)
Physical Therapy Evaluation Patient Details Name: Lawrence [name] MRN: [id number] DOB: 27-Oct-1956 Today's Date: 10/22/2017   History of Present Illness  Pt s/p R TKR and with hx of DM and CKD  Clinical Impression  Pt s/p R TKR and presents with decreased R LE strength/ROM and post op pain limiting functional mobility.  Pt should progress to dc home with family assist.    Follow Up Recommendations Outpatient PT(Starting 10/28/17)    Equipment Recommendations  None recommended by PT    Recommendations for Other Services       Precautions / Restrictions Precautions Precautions: Knee;Fall Required Braces or Orthoses: Knee Immobilizer - Right Knee Immobilizer - Right: Discontinue once straight leg raise with < 10 degree lag Restrictions Weight Bearing Restrictions: No Other Position/Activity Restrictions: WBAT      Mobility  Bed Mobility Overal bed mobility: Needs Assistance Bed Mobility: Supine to Sit     Supine to sit: Min assist     General bed mobility comments: cues for sequence and use of L LE to self assist  Transfers Overall transfer level: Needs assistance Equipment used: Rolling walker (2 wheeled) Transfers: Sit to/from Stand Sit to Stand: Min assist         General transfer comment: cues for LE management and use of UEs to self assist  Ambulation/Gait Ambulation/Gait assistance: Min assist Ambulation Distance (Feet): 68 Feet Assistive device: Rolling walker (2 wheeled) Gait Pattern/deviations: Step-to pattern;Decreased step length - right;Decreased step length - left;Shuffle;Trunk flexed Gait velocity: decr Gait velocity interpretation: Below normal speed for age/gender General Gait Details: cues for sequence, posture and position from [employer]            Wheelchair Mobility    Modified Rankin (Stroke Patients Only)       Balance Overall balance assessment: No apparent balance deficits (not formally assessed)                                           Pertinent Vitals/Pain Pain Assessment: 0-10 Pain Score: 5  Pain Location: R knee/thigh Pain Descriptors / Indicators: Aching;Sore Pain Intervention(s): Limited activity within patient's tolerance;Monitored during session;Premedicated before session;Patient requesting pain meds-RN notified;Ice applied    Home Living Family/patient expects to be discharged to:: Private residence Living Arrangements: Spouse/significant other Available Help at Discharge: Family Type of Home: House Home Access: Stairs to enter Entrance Stairs-Rails: Right;Left;None Technical brewer of Steps: 2+3(No rails on 2 step and wide rails on 3 step) Home Layout: Able to live on main level with bedroom/bathroom Home Equipment: Walker - 2 wheels;Cane - single point      Prior Function Level of Independence: Independent               Hand Dominance        Extremity/Trunk Assessment   Upper Extremity Assessment Upper Extremity Assessment: Overall WFL for tasks assessed    Lower Extremity Assessment Lower Extremity Assessment: RLE deficits/detail    Cervical / Trunk Assessment Cervical / Trunk Assessment: Normal  Communication   Communication: No difficulties  Cognition Arousal/Alertness: Awake/alert Behavior During Therapy: WFL for tasks assessed/performed Overall Cognitive Status: Within Functional Limits for tasks assessed  General Comments      Exercises Total Joint Exercises Ankle Circles/Pumps: AROM;Both;20 reps;Supine   Assessment/Plan    PT Assessment Patient needs continued PT services  PT Problem List Decreased strength;Decreased range of motion;Decreased activity tolerance;Decreased mobility;Decreased knowledge of use of DME;Pain;Obesity       PT Treatment Interventions DME instruction;Gait training;Stair training;Functional mobility training;Therapeutic  activities;Therapeutic exercise;Patient/family education    PT Goals (Current goals can be found in the Care Plan section)  Acute Rehab PT Goals Patient Stated Goal: Regain IND PT Goal Formulation: With patient Time For Goal Achievement: 10/29/17 Potential to Achieve Goals: Good    Frequency 7X/week   Barriers to discharge        Co-evaluation               AM-PAC PT "6 Clicks" Daily Activity  Outcome Measure Difficulty turning over in bed (including adjusting bedclothes, sheets and blankets)?: Unable Difficulty moving from lying on back to sitting on the side of the bed? : Unable Difficulty sitting down on and standing up from a chair with arms (e.g., wheelchair, bedside commode, etc,.)?: Unable Help needed moving to and from a bed to chair (including a wheelchair)?: A Little Help needed walking in hospital room?: A Little Help needed climbing 3-5 steps with a railing? : A Little 6 Click Score: 12    End of Session Equipment Utilized During Treatment: Gait belt;Right knee immobilizer Activity Tolerance: Patient tolerated treatment well Patient left: in chair;with call bell/phone within reach;with family/visitor present Nurse Communication: Mobility status PT Visit Diagnosis: Difficulty in walking, not elsewhere classified (R26.2)    Time: 6283-1517 PT Time Calculation (min) (ACUTE ONLY): 23 min   Charges:   PT Evaluation $PT Eval Low Complexity: 1 Low     PT G Codes:        Pg [phone]   Maisen Schmit 10/22/2017, 3:35 PM

## 2017-10-22 NOTE — Anesthesia Procedure Notes (Signed)
Spinal  Patient location during procedure: OR Staffing Anesthesiologist: Lyndle Herrlich, MD Spinal Block Patient position: sitting Prep: DuraPrep Patient monitoring: heart rate, blood pressure and continuous pulse ox Approach: right paramedian Location: L3-4 Injection technique: single-shot Needle Needle type: Sprotte and Tuohy  Needle gauge: 24 G Needle length: 9 cm Needle insertion depth: 8 cm Assessment Sensory level: T4 Additional Notes 120 mm Sprotte thru tuohy spinal Dosage in OR  .75% Bupivicaine ml       1.9

## 2017-10-22 NOTE — Interval H&P Note (Signed)
History and Physical Interval Note:  10/22/2017 7:08 AM  Lawrence [name]  has presented today for surgery, with the diagnosis of Degenerative joint disease right knee  The various methods of treatment have been discussed with the patient and family. After consideration of risks, benefits and other options for treatment, the patient has consented to  Procedure(s) with comments: RIGHT TOTAL KNEE ARTHROPLASTY (Right) - 120 mins as a surgical intervention .  The patient's history has been reviewed, patient examined, no change in status, stable for surgery.  I have reviewed the patient's chart and labs.  Questions were answered to the patient's satisfaction.     Anaisabel Pederson C

## 2017-10-22 NOTE — Anesthesia Procedure Notes (Signed)
Date/Time: 10/22/2017 7:06 AM Performed by: Glory Buff, CRNA Oxygen Delivery Method: Simple face mask

## 2017-10-22 NOTE — Discharge Instructions (Signed)

## 2017-10-22 NOTE — Anesthesia Procedure Notes (Signed)
Anesthesia Regional Block: Adductor canal block   Pre-Anesthetic Checklist: ,, timeout performed, Correct Patient, Correct Site, Correct Laterality, Correct Procedure, Correct Position, site marked, Risks and benefits discussed, pre-op evaluation,  At surgeon's request and post-op pain management  Laterality: Right  Prep: chloraprep       Needles:   Needle Type: Echogenic Needle     Needle Length: 9cm  Needle Gauge: 21     Additional Needles:   Procedures:,,,, ultrasound used (permanent image in chart),,,,  Narrative:  Start time: 10/22/2017 7:08 AM End time: 10/22/2017 7:17 AM Injection made incrementally with aspirations every 5 mL. Anesthesiologist: Lyndle Herrlich, MD

## 2017-10-22 NOTE — Transfer of Care (Signed)
Immediate Anesthesia Transfer of Care Note  Patient: Lawrence [name]  Procedure(s) Performed: RIGHT TOTAL KNEE ARTHROPLASTY (Right Knee)  Patient Location: PACU  Anesthesia Type:MAC and Spinal  Level of Consciousness: awake, alert  and oriented  Airway & Oxygen Therapy: Patient Spontanous Breathing and Patient connected to face mask oxygen  Post-op Assessment: Report given to RN and Post -op Vital signs reviewed and stable  Post vital signs: Reviewed and stable  Last Vitals:  Vitals:   10/22/17 0612  BP: 132/74  Pulse: (!) 53  Resp: 18  Temp: 36.8 C  SpO2: 97%    Last Pain:  Vitals:   10/22/17 0618  TempSrc:   PainSc: 2       Patients Stated Pain Goal: 4 (92/42/68 3419)  Complications: No apparent anesthesia complications

## 2017-10-23 ENCOUNTER — Encounter (HOSPITAL_COMMUNITY): Payer: Self-pay | Admitting: Specialist

## 2017-10-23 LAB — CBC
HEMATOCRIT: 41.3 % (ref 39.0–52.0)
Hemoglobin: 13.7 g/dL (ref 13.0–17.0)
MCH: 29 pg (ref 26.0–34.0)
MCHC: 33.2 g/dL (ref 30.0–36.0)
MCV: 87.3 fL (ref 78.0–100.0)
PLATELETS: 215 10*3/uL (ref 150–400)
RBC: 4.73 MIL/uL (ref 4.22–5.81)
RDW: 13.2 % (ref 11.5–15.5)
WBC: 11.2 10*3/uL — AB (ref 4.0–10.5)

## 2017-10-23 LAB — BASIC METABOLIC PANEL
ANION GAP: 6 (ref 5–15)
BUN: 23 mg/dL — ABNORMAL HIGH (ref 6–20)
CALCIUM: 8.4 mg/dL — AB (ref 8.9–10.3)
CO2: 26 mmol/L (ref 22–32)
Chloride: 104 mmol/L (ref 101–111)
Creatinine, Ser: 1.2 mg/dL (ref 0.61–1.24)
GLUCOSE: 135 mg/dL — AB (ref 65–99)
POTASSIUM: 4 mmol/L (ref 3.5–5.1)
Sodium: 136 mmol/L (ref 135–145)

## 2017-10-23 LAB — GLUCOSE, CAPILLARY
GLUCOSE-CAPILLARY: 104 mg/dL — AB (ref 65–99)
GLUCOSE-CAPILLARY: 112 mg/dL — AB (ref 65–99)
GLUCOSE-CAPILLARY: 104 mg/dL — AB (ref 65–99)
Glucose-Capillary: 104 mg/dL — ABNORMAL HIGH (ref 65–99)

## 2017-10-23 NOTE — Progress Notes (Signed)
Physical Therapy Treatment Patient Details Name: Lawrence [name] MRN: [id number] DOB: 20-Nov-1955 Today's Date: 10/23/2017    History of Present Illness Pt s/p R TKR and with hx of DM and CKD    PT Comments    Pt very motivated and progressing well with mobility.   Follow Up Recommendations  Outpatient PT     Equipment Recommendations  None recommended by PT    Recommendations for Other Services       Precautions / Restrictions Precautions Precautions: Knee;Fall Required Braces or Orthoses: Knee Immobilizer - Right Knee Immobilizer - Right: Discontinue once straight leg raise with < 10 degree lag Restrictions Weight Bearing Restrictions: No Other Position/Activity Restrictions: WBAT    Mobility  Bed Mobility Overal bed mobility: Needs Assistance Bed Mobility: Supine to Sit     Supine to sit: Min assist     General bed mobility comments: cues for sequence and use of L LE to self assist  Transfers Overall transfer level: Needs assistance Equipment used: Rolling walker (2 wheeled) Transfers: Sit to/from Stand Sit to Stand: Min guard         General transfer comment: cues for LE management and use of UEs to self assist  Ambulation/Gait Ambulation/Gait assistance: Min guard Ambulation Distance (Feet): 100 Feet Assistive device: Rolling walker (2 wheeled) Gait Pattern/deviations: Decreased step length - right;Decreased step length - left;Shuffle;Trunk flexed;Step-to pattern;Step-through pattern Gait velocity: decr Gait velocity interpretation: Below normal speed for age/gender General Gait Details: cues for sequence, posture and position from [employer]            Wheelchair Mobility    Modified Rankin (Stroke Patients Only)       Balance Overall balance assessment: No apparent balance deficits (not formally assessed)                                          Cognition Arousal/Alertness: Awake/alert Behavior During  Therapy: WFL for tasks assessed/performed Overall Cognitive Status: Within Functional Limits for tasks assessed                                        Exercises Total Joint Exercises Ankle Circles/Pumps: AROM;Both;20 reps;Supine Quad Sets: AROM;Both;10 reps;Supine Heel Slides: AAROM;Right;15 reps;Supine Straight Leg Raises: AAROM;Right;10 reps;Supine Goniometric ROM: AAROM at R knee -10 - 55    General Comments        Pertinent Vitals/Pain Pain Assessment: 0-10 Pain Score: 6  Pain Location: R knee/thigh Pain Descriptors / Indicators: Aching;Sore Pain Intervention(s): Limited activity within patient's tolerance;Monitored during session;Premedicated before session;RN gave pain meds during session;Ice applied    Home Living                      Prior Function            PT Goals (current goals can now be found in the care plan section) Acute Rehab PT Goals Patient Stated Goal: Regain IND PT Goal Formulation: With patient Time For Goal Achievement: 10/29/17 Potential to Achieve Goals: Good Progress towards PT goals: Progressing toward goals    Frequency    7X/week      PT Plan Current plan remains appropriate    Co-evaluation              AM-PAC PT "  6 Clicks" Daily Activity  Outcome Measure  Difficulty turning over in bed (including adjusting bedclothes, sheets and blankets)?: Unable Difficulty moving from lying on back to sitting on the side of the bed? : Unable Difficulty sitting down on and standing up from a chair with arms (e.g., wheelchair, bedside commode, etc,.)?: A Lot Help needed moving to and from a bed to chair (including a wheelchair)?: A Little Help needed walking in hospital room?: A Little Help needed climbing 3-5 steps with a railing? : A Little 6 Click Score: 13    End of Session Equipment Utilized During Treatment: Gait belt;Right knee immobilizer Activity Tolerance: Patient tolerated treatment well Patient  left: in chair;with call bell/phone within reach;with family/visitor present Nurse Communication: Mobility status PT Visit Diagnosis: Difficulty in walking, not elsewhere classified (R26.2)     Time: 2263-3354 PT Time Calculation (min) (ACUTE ONLY): 30 min  Charges:  $Gait Training: 8-22 mins $Therapeutic Exercise: 8-22 mins                    G Codes:       TG [phone]    Lawrence [name] 10/23/2017, 11:01 AM

## 2017-10-23 NOTE — Anesthesia Postprocedure Evaluation (Signed)
Anesthesia Post Note  Patient: Lawrence [name]  Procedure(s) Performed: RIGHT TOTAL KNEE ARTHROPLASTY (Right Knee)     Patient location during evaluation: PACU Anesthesia Type: Spinal Level of consciousness: awake Pain management: satisfactory to patient Vital Signs Assessment: post-procedure vital signs reviewed and stable Respiratory status: spontaneous breathing Cardiovascular status: blood pressure returned to baseline Postop Assessment: no headache and spinal receding Anesthetic complications: no    Last Vitals:  Vitals:   10/22/17 2106 10/23/17 0234  BP: 131/64 118/65  Pulse: (!) 55 (!) 52  Resp: 18 18  Temp: 37 C 36.8 C  SpO2: 98% 98%    Last Pain:  Vitals:   10/23/17 0240  TempSrc:   PainSc: [place]

## 2017-10-23 NOTE — Progress Notes (Signed)
Discharge plan:  Pt for outpatient PT scheduled for 10/28/17. 3in1 to be delivered to room by [facility]. Marney Doctor RN,BSN,NCM [phone]

## 2017-10-23 NOTE — Progress Notes (Signed)
Patient ID: Lawrence [name], male   DOB: 1955/12/25, 61 y.o.   MRN: [id number] Subjective: 1 Day Post-Op Procedure(s) (LRB): RIGHT TOTAL KNEE ARTHROPLASTY (Right) Patient reports pain as mild.   Foley removed this AM, already voided, feels he fully voided.  Patient has complaints of R knee and thigh pain. No other c/o.  We will start therapy today. Plan is to go home after hospital stay and start oupt PT Weds 12/26.  Objective: Vital signs in last 24 hours: Temp:  [98.2 F (36.8 C)-98.9 F (37.2 C)] 98.2 F (36.8 C) (12/21 0525) Pulse Rate:  [52-68] 62 (12/21 0828) Resp:  [15-22] 18 (12/21 0525) BP: (107-131)/(53-72) 119/53 (12/21 0828) SpO2:  [92 %-99 %] 95 % (12/21 0525)  Intake/Output from previous day:  Intake/Output Summary (Last 24 hours) at 10/23/2017 0916 Last data filed at 10/23/2017 0530 Gross per 24 hour  Intake 4737.5 ml  Output 3975 ml  Net 762.5 ml    Intake/Output this shift: No intake/output data recorded.  Labs: Results for orders placed or performed during the hospital encounter of 10/22/17  Glucose, capillary  Result Value Ref Range   Glucose-Capillary 104 (H) 65 - 99 mg/dL  Glucose, capillary  Result Value Ref Range   Glucose-Capillary 133 (H) 65 - 99 mg/dL   Comment 1 Notify RN    Comment 2 Document in Chart   Glucose, capillary  Result Value Ref Range   Glucose-Capillary 146 (H) 65 - 99 mg/dL  CBC  Result Value Ref Range   WBC 11.2 (H) 4.0 - 10.5 K/uL   RBC 4.73 4.22 - 5.81 MIL/uL   Hemoglobin 13.7 13.0 - 17.0 g/dL   HCT 41.3 39.0 - 52.0 %   MCV 87.3 78.0 - 100.0 fL   MCH 29.0 26.0 - 34.0 pg   MCHC 33.2 30.0 - 36.0 g/dL   RDW 13.2 11.5 - 15.5 %   Platelets 215 150 - 400 K/uL  Basic metabolic panel  Result Value Ref Range   Sodium 136 135 - 145 mmol/L   Potassium 4.0 3.5 - 5.1 mmol/L   Chloride 104 101 - 111 mmol/L   CO2 26 22 - 32 mmol/L   Glucose, Bld 135 (H) 65 - 99 mg/dL   BUN 23 (H) 6 - 20 mg/dL   Creatinine, Ser 1.20 0.61 -  1.24 mg/dL   Calcium 8.4 (L) 8.9 - 10.3 mg/dL   GFR calc non Af Amer >60 >60 mL/min   GFR calc Af Amer >60 >60 mL/min   Anion gap 6 5 - 15  Glucose, capillary  Result Value Ref Range   Glucose-Capillary 140 (H) 65 - 99 mg/dL  Glucose, capillary  Result Value Ref Range   Glucose-Capillary 104 (H) 65 - 99 mg/dL    Exam - Neurologically intact ABD soft Neurovascular intact Sensation intact distally Intact pulses distally Dorsiflexion/Plantar flexion intact Incision: dressing C/D/I and no drainage No cellulitis present Compartment soft no calf pain or sign of DVT Dressing - clean, dry, no drainage Motor function intact - moving foot and toes well on exam.   Assessment/Plan: 1 Day Post-Op Procedure(s) (LRB): RIGHT TOTAL KNEE ARTHROPLASTY (Right)  Advance diet Up with therapy D/C IV fluids Past Medical History:  Diagnosis Date  . Acute renal insufficiency 2016   history of  . Anemia    iron deficiency  . Arthritis    osteoarthritis both knees  . Bilateral cataracts   . Cancer ([place])    kidney  . Cellulitis  leg  . Chronic kidney disease 2016   bil renal mass, CKD stage 3  . Colon polyp   . Diabetes mellitus without complication ([place])    type II  . Dyslipidemia   . Hypertension   . Non-traumatic rhabdomyolysis    during surgery  . Renal cyst 2016   bilateral  . 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 Sleep apnea    uses c-pap machine  . Varicose veins of legs   . Vitamin B 12 deficiency     DVT Prophylaxis - ASA 325mg  BID Protocol Weight-Bearing as tolerated to Right leg No vaccines.  Lupita Rosales M. 10/23/2017, 9:16 AM

## 2017-10-23 NOTE — Progress Notes (Signed)
CSW consult-SNF Per PT/ physician recommendations the patient will completed outpatient PT.  CSW signing off.   Kathrin Greathouse, Latanya Presser, MSW Clinical Social Worker  [phone] 10/23/2017  8:38 AM

## 2017-10-23 NOTE — Op Note (Signed)
Lawrence [name], Lawrence [name]              ACCOUNT NO.:  [id number]  MEDICAL RECORD NO.:  [id number]  LOCATION:                                 FACILITY:  PHYSICIAN:  Susa Day, M.D.         DATE OF BIRTH:  DATE OF PROCEDURE:  10/22/2017 DATE OF DISCHARGE:                              OPERATIVE REPORT   PREOPERATIVE DIAGNOSIS:  End-stage osteoarthrosis, varus deformity of the right knee.  POSTOPERATIVE DIAGNOSIS:  End-stage osteoarthrosis, varus deformity of the right knee.  PROCEDURE PERFORMED:  Right total knee arthroplasty utilizing DePuy Attune, 9 femur, 8 tibia, 5 insert, 41 patella.  ANESTHESIA:  Spinal.  ASSISTANT:  Lacie Draft, PA.  HISTORY:  This is a 61 year old male with end-stage osteoarthritis of the knee, varus deformity and a significant flexion contracture of 20 degrees.  He had flexion only to 85.  He had bone-on-bone arthrosis, medial compartment with varus deformity, indicated for replacement of the degenerated joint, failing conservative treatment.  Risks and benefits discussed including bleeding, infection, damage to the neurovascular structures, no change in symptoms, worsening symptoms, DVT, PE, anesthetic complications, etc.  TECHNIQUE:  With the patient in supine position after induction of adequate __________ anesthesia and 3 g of Kefzol and 900 of clindamycin for further coverage, the right lower extremity was prepped, draped and exsanguinated in usual sterile fashion.  Thigh tourniquet inflated to 250 mmHg.  Midline incision was then made over the skin.  Over the anterior patella, full-thickness flaps developed.  He had scar tissue in the bursal wound region.  We incised that.  Median parapatellar arthrotomy was performed.  We elevated the soft tissues medially preserving the MCL, and large osteophytes were noted tricompartmentally and the hypertrophic synovitis.  We performed a synovectomy.  We used a Lawyer for assistance.  After  elevating the soft tissues medially, we flexed the knee, everted the patella and removed osteophytes with a rongeur from the patella, femur and tibia.  Remnants of the medial lateral menisci were removed as well, as well as the ACL. Step drill was then utilized to enter the femoral canal.  It was irrigated, and a 5-degree right was selected with 10 off the distal femur due to the flexion contracture.  This was then pinned and 3 degrees of external rotation off the anterior cortex, measuring a 9. That provided sufficient resection posteriorly of the medial femoral condyle.  He had a deficient lateral femoral condyle.  After performing our cut, I then sized the femur off the anterior cortex, it was a 9. Shallow anterior cortex flare.  This was 3 degrees of external rotation, measured to a 9.  I performed anterior, posterior and chamfer cuts protecting the soft tissues at all times.  I did not notch the femur. Osteophytes were removed posteriorly with a curved Crego and an osteotome.  I then subluxed the tibia, it was fairly tight, recessed the PCL.  It was bone-on-bone particularly the medial tibial plateau. External alignment guide was utilized, 2 off the defect posteromedially bisecting the tibiotalar joint, 3 degrees slope, 10 off the lateral side with the varus deformity.  I felt that this was adequate resection.  We  used our oscillating saw to perform a tibial cut protecting the soft tissues at all times.  We used our Mantis coagulator for the geniculates.  Next, this was sized to an 8 just the medial aspect of the tibial tubercle, pinned.  We harvested the bone from the tibial canal, drilled it centrally and used our punch guide.  We then placed a 9 femur, drilled our lug holes.  Initially, the femur did not sit flushed and we revised the anterior cut and posterior cuts, then flushed.  This was after we had performed our box cut bisecting the canal.  The trial femur and tibia, I  placed a 5 insert, reduced it, had full extension and full flexion, good stability with varus-valgus stressing at 0-30 degrees, negative anterior drawer.  Measured the patella at a 30, planed it to a 19.  It was measured at a 41.  The patella was resected parallel to the anterior surface.  Measured to a 41, medializing our drill holes. They were then drilled and a trial 41 patella was placed.  It was reduced and had excellent patellofemoral tracking.  All instrumentation was then removed.  Pulsatile lavage was utilized to irrigate the joint. We then used our Mantis to coagulate the posterior capsule and geniculates once again.  Then flexed the knee and thoroughly dried all surfaces, and to increase the tourniquet to 275 during curing of the cement.  The cement was mixed on the back table in appropriate fashion and injected into the tibial canal digitally pressurizing it and impacted the tibial tray, redundant cement removed.  We cemented and impacted the femur, redundant cement removed; inserted a 5 insert, reduced it and held an axial load throughout the curing of the cement, cemented and clamped the patella.  A 0.25% Marcaine with epinephrine was infiltrated in the periosteum of the femur and in the posterior capsule. Covered the wound.  After curing of the cement, the tourniquet was deflated and cauterized any bleeding with electrocautery and the Mantis. Flexed the knee and then meticulously removed all redundant cement.  We felt the 5 was satisfactory, subluxed the tibia, pulsatile lavage and antibiotic irrigation, permanent 5 insert was selected, it was reduced and we had full extension, full flexion, good stability, varus and valgus stressing at 0-30 degrees, negative anterior drawer.  We then reapproximated the patellar arthrotomy with #1 Vicryl interrupted figure- of-eight sutures and then a running Stratafix.  He had flexion to gravity at 90 degrees and after the suturing of the  arthrotomy, we still had excellent patellofemoral tracking and negative anterior drawer. Copiously irrigated with antibiotic irrigation, subcu with 2-0 and skin with staples.  Wound was dressed sterilely.  Placed in a knee immobilizer, transported to the recovery room in satisfactory condition.  The patient tolerated the procedure well.  No complications.  Minimal blood loss.  Assistant, Lacie Draft, [place].     Susa Day, M.D.   ______________________________ Susa Day, M.D.    Geralynn Rile  D:  10/22/2017  T:  10/23/2017  Job:  [id number]

## 2017-10-23 NOTE — Evaluation (Signed)
Occupational Therapy Evaluation Patient Details Name: Lawrence [name] MRN: [id number] DOB: [birth year] Today's Date: 10/23/2017    History of Present Illness Pt s/p R TKR and with hx of DM and CKD   Clinical Impression   This 61 year old man was admitted for the above sx. All education was completed. No further OT is needed at this time     Follow Up Recommendations  No OT follow up    Equipment Recommendations  3 in 1 bedside commode    Recommendations for Other Services       Precautions / Restrictions Precautions Precautions: Knee;Fall Required Braces or Orthoses: Knee Immobilizer - Right Knee Immobilizer - Right: Discontinue once straight leg raise with < 10 degree lag Restrictions Weight Bearing Restrictions: No Other Position/Activity Restrictions: WBAT      Mobility Bed Mobility Overal bed mobility: Needs Assistance Bed Mobility: Supine to Sit     Supine to sit: Supervision Sit to supine: Supervision   General bed mobility comments: HOB raised.  Pt used rails  Transfers Overall transfer level: Needs assistance Equipment used: Rolling walker (2 wheeled) Transfers: Sit to/from Stand Sit to Stand: Min guard         General transfer comment: for safety. Cues for UE placement    Balance Overall balance assessment: No apparent balance deficits (not formally assessed)                                         ADL either performed or assessed with clinical judgement   ADL Overall ADL's : Needs assistance/impaired Eating/Feeding: Independent   Grooming: Supervision/safety;Standing   Upper Body Bathing: Set up;Sitting   Lower Body Bathing: Sit to/from stand;Min guard(has long sponge)   Upper Body Dressing : Set up;Sitting   Lower Body Dressing: Moderate assistance;Sit to/from stand   Toilet Transfer: Min guard;BSC;RW;Ambulation   Toileting- Water quality scientist and Hygiene: Min guard;Sit to/from stand         General  ADL Comments: pt has a tub shower and will sponge bathe initially. Educated on tub readiness     Vision         Perception     Praxis      Pertinent Vitals/Pain Pain Assessment: Faces Pain Score: 6  Faces Pain Scale: Hurts even more Pain Location: R knee/thigh(after activity) Pain Descriptors / Indicators: Aching;Sore Pain Intervention(s): Limited activity within patient's tolerance;Monitored during session     Hand Dominance     Extremity/Trunk Assessment Upper Extremity Assessment Upper Extremity Assessment: Overall WFL for tasks assessed           Communication Communication Communication: No difficulties   Cognition Arousal/Alertness: Awake/alert Behavior During Therapy: WFL for tasks assessed/performed Overall Cognitive Status: Within Functional Limits for tasks assessed                                     General Comments       Exercises    Shoulder Instructions      Home Living Family/patient expects to be discharged to:: Private residence Living Arrangements: Spouse/significant other Available Help at Discharge: Family               Bathroom Shower/Tub: Teacher, early years/pre: Standard     Home Equipment: Environmental consultant - 2 wheels;Cane - single point  Additional Comments: has elevated toilet seat, but it clamps on and is not secure      Prior Functioning/Environment Level of Independence: Independent                 OT Problem List:        OT Treatment/Interventions:      OT Goals(Current goals can be found in the care plan section) Acute Rehab OT Goals Patient Stated Goal: Regain IND OT Goal Formulation: All assessment and education complete, DC therapy  OT Frequency:     Barriers to D/C:            Co-evaluation              AM-PAC PT "6 Clicks" Daily Activity     Outcome Measure Help from another person eating meals?: None Help from another person taking care of personal grooming?: A  Little Help from another person toileting, which includes using toliet, bedpan, or urinal?: A Little Help from another person bathing (including washing, rinsing, drying)?: A Little Help from another person to put on and taking off regular upper body clothing?: A Little Help from another person to put on and taking off regular lower body clothing?: A Lot 6 Click Score: 18   End of Session    Activity Tolerance: Patient tolerated treatment well Patient left: in bed;with call bell/phone within reach;with family/visitor present  OT Visit Diagnosis: Pain Pain - Right/Left: Right Pain - part of body: Knee                Time: 8333-8329 OT Time Calculation (min): 22 min Charges:  OT General Charges $OT Visit: 1 Visit OT Evaluation $OT Eval Low Complexity: 1 Low G-Codes:     Lesle Chris, OTR/L [phone] 10/23/2017  Jadira Nierman 10/23/2017, 12:32 PM

## 2017-10-23 NOTE — Progress Notes (Signed)
Physical Therapy Treatment Patient Details Name: Lawrence [name] MRN: [id number] DOB: 1956/10/15 Today's Date: 10/23/2017    History of Present Illness Pt s/p R TKR and with hx of DM and CKD    PT Comments    Pt progressing steadily this pm with mobility.   Follow Up Recommendations  Outpatient PT     Equipment Recommendations  None recommended by PT    Recommendations for Other Services       Precautions / Restrictions Precautions Precautions: Knee;Fall Required Braces or Orthoses: Knee Immobilizer - Right Knee Immobilizer - Right: Discontinue once straight leg raise with < 10 degree lag Restrictions Weight Bearing Restrictions: No Other Position/Activity Restrictions: WBAT    Mobility  Bed Mobility Overal bed mobility: Needs Assistance Bed Mobility: Supine to Sit     Supine to sit: Supervision     General bed mobility comments: Pt self assisting L LE with UEs  Transfers Overall transfer level: Needs assistance Equipment used: Rolling walker (2 wheeled) Transfers: Sit to/from Stand Sit to Stand: Min guard         General transfer comment: for safety. Cues for UE placement and LE management  Ambulation/Gait Ambulation/Gait assistance: Min guard Ambulation Distance (Feet): 125 Feet Assistive device: Rolling walker (2 wheeled) Gait Pattern/deviations: Decreased step length - right;Decreased step length - left;Shuffle;Trunk flexed;Step-to pattern;Step-through pattern Gait velocity: decr Gait velocity interpretation: Below normal speed for age/gender General Gait Details: cues for sequence, posture and position from [employer]            Wheelchair Mobility    Modified Rankin (Stroke Patients Only)       Balance Overall balance assessment: No apparent balance deficits (not formally assessed)                                          Cognition Arousal/Alertness: Awake/alert Behavior During Therapy: WFL for tasks  assessed/performed Overall Cognitive Status: Within Functional Limits for tasks assessed                                        Exercises      General Comments        Pertinent Vitals/Pain Pain Assessment: 0-10 Pain Score: 4 (and 7/10 L buttock pain disipated with ambulation) Pain Location: R knee/thigh Pain Descriptors / Indicators: Aching;Sore Pain Intervention(s): Limited activity within patient's tolerance;Monitored during session;Premedicated before session;Ice applied    Home Living                      Prior Function            PT Goals (current goals can now be found in the care plan section) Acute Rehab PT Goals Patient Stated Goal: Regain IND PT Goal Formulation: With patient Time For Goal Achievement: 10/29/17 Potential to Achieve Goals: Good Progress towards PT goals: Progressing toward goals    Frequency    7X/week      PT Plan Current plan remains appropriate    Co-evaluation              AM-PAC PT "6 Clicks" Daily Activity  Outcome Measure  Difficulty turning over in bed (including adjusting bedclothes, sheets and blankets)?: A Lot Difficulty moving from lying on back to sitting on the side of  the bed? : A Lot Difficulty sitting down on and standing up from a chair with arms (e.g., wheelchair, bedside commode, etc,.)?: A Lot Help needed moving to and from a bed to chair (including a wheelchair)?: A Little Help needed walking in hospital room?: A Little Help needed climbing 3-5 steps with a railing? : A Little 6 Click Score: 15    End of Session Equipment Utilized During Treatment: Gait belt;Right knee immobilizer Activity Tolerance: Patient tolerated treatment well Patient left: in chair;with call bell/phone within reach;with family/visitor present Nurse Communication: Mobility status PT Visit Diagnosis: Difficulty in walking, not elsewhere classified (R26.2)     Time: 9833-8250 PT Time Calculation (min)  (ACUTE ONLY): 22 min  Charges:  $Gait Training: 8-22 mins                    G Codes:       [id number] [phone]    Jailen Coward 10/23/2017, 3:30 PM

## 2017-10-24 LAB — CBC
HEMATOCRIT: 40.7 % (ref 39.0–52.0)
HEMOGLOBIN: 13.8 g/dL (ref 13.0–17.0)
MCH: 29.6 pg (ref 26.0–34.0)
MCHC: 33.9 g/dL (ref 30.0–36.0)
MCV: 87.2 fL (ref 78.0–100.0)
Platelets: 198 10*3/uL (ref 150–400)
RBC: 4.67 MIL/uL (ref 4.22–5.81)
RDW: 13.4 % (ref 11.5–15.5)
WBC: 12.2 10*3/uL — ABNORMAL HIGH (ref 4.0–10.5)

## 2017-10-24 LAB — GLUCOSE, CAPILLARY: GLUCOSE-CAPILLARY: 111 mg/dL — AB (ref 65–99)

## 2017-10-24 NOTE — Progress Notes (Signed)
Physical Therapy Treatment Patient Details Name: Lawrence [name] MRN: [id number] DOB: October 06, 1956 Today's Date: 10/24/2017    History of Present Illness Pt s/p R TKR and with hx of DM and CKD    PT Comments    POD # 2 pm session Practiced stairs with spouse present and performed all supine TKR TE's following HEP handout.   Follow Up Recommendations  Outpatient PT     Equipment Recommendations  None recommended by PT    Recommendations for Other Services       Precautions / Restrictions Precautions Precautions: Knee;Fall Precaution Comments: Instructed on proper application and use esp for stairs  Required Braces or Orthoses: Knee Immobilizer - Right Knee Immobilizer - Right: Discontinue once straight leg raise with < 10 degree lag Restrictions Weight Bearing Restrictions: No Other Position/Activity Restrictions: WBAT    Mobility  Bed Mobility Overal bed mobility: Needs Assistance Bed Mobility: Supine to Sit     Supine to sit: Supervision     General bed mobility comments: Pt self assisting L LE with UEs  Transfers Overall transfer level: Needs assistance Equipment used: Rolling walker (2 wheeled) Transfers: Sit to/from Stand Sit to Stand: Min guard         General transfer comment: for safety. Cues for UE placement and LE management  Ambulation/Gait Ambulation/Gait assistance: Min guard Ambulation Distance (Feet): 75 Feet Assistive device: Rolling walker (2 wheeled) Gait Pattern/deviations: Decreased step length - right;Decreased step length - left;Shuffle;Trunk flexed;Step-to pattern;Step-through pattern Gait velocity: decr   General Gait Details: cues for sequence, posture and position from RW   Stairs Stairs: Yes   Stair Management: No rails;Step to pattern;Backwards;With walker Number of Stairs: 2 General stair comments: with spouse present for "hands on" instruction up back ward due to no rails  Wheelchair Mobility    Modified  Rankin (Stroke Patients Only)       Balance                                            Cognition Arousal/Alertness: Awake/alert Behavior During Therapy: WFL for tasks assessed/performed Overall Cognitive Status: Within Functional Limits for tasks assessed                                        Exercises   Total Knee Replacement TE's 10 reps B LE ankle pumps 10 reps towel squeezes 10 reps knee presses 10 reps heel slides  10 reps SAQ's 10 reps SLR's 10 reps ABD Followed by ICE     General Comments        Pertinent Vitals/Pain Pain Assessment: 0-10 Pain Score: 5  Pain Location: R knee/thigh Pain Descriptors / Indicators: Aching;Sore;Operative site guarding Pain Intervention(s): Monitored during session;Repositioned;Ice applied;Premedicated before session    Home Living                      Prior Function            PT Goals (current goals can now be found in the care plan section) Progress towards PT goals: Progressing toward goals    Frequency    7X/week      PT Plan Current plan remains appropriate    Co-evaluation  AM-PAC PT "6 Clicks" Daily Activity  Outcome Measure  Difficulty turning over in bed (including adjusting bedclothes, sheets and blankets)?: A Lot Difficulty moving from lying on back to sitting on the side of the bed? : A Lot Difficulty sitting down on and standing up from a chair with arms (e.g., wheelchair, bedside commode, etc,.)?: A Lot Help needed moving to and from a bed to chair (including a wheelchair)?: A Little Help needed walking in hospital room?: A Little Help needed climbing 3-5 steps with a railing? : A Little 6 Click Score: 15    End of Session Equipment Utilized During Treatment: Gait belt Activity Tolerance: Patient tolerated treatment well Patient left: in chair;with call bell/phone within reach;with family/visitor present Nurse Communication: Mobility  status PT Visit Diagnosis: Difficulty in walking, not elsewhere classified (R26.2)     Time: 1033-1100 PT Time Calculation (min) (ACUTE ONLY): 27 min  Charges:  $Gait Training: 8-22 mins $Therapeutic Exercise: 8-22 mins                     G Codes:       {Maggie Senseney  PTA [facility]  Acute  Rehab Pager      [phone]

## 2017-10-24 NOTE — Progress Notes (Signed)
Delivered 3n1 bedside commode to room. Jonnie Finner RN CCM Case Mgmt phone [phone]

## 2017-10-24 NOTE — Progress Notes (Signed)
Physical Therapy Treatment Patient Details Name: Lawrence [name] MRN: [id number] DOB: 1956-05-30 Today's Date: 10/24/2017    History of Present Illness Pt s/p R TKR and with hx of DM and CKD    PT Comments    POD # 2 am session Assisted OOB to amb in hallway then returned to room and performed TKR TE's followed by ICE.  Spouse present during session and very helpful.  Educated on KI use and proper application, educated on mobility safety and HEP.  Pt will need another PT session to address stairs prior to D/C to home.   Follow Up Recommendations  Outpatient PT     Equipment Recommendations  None recommended by PT    Recommendations for Other Services       Precautions / Restrictions Precautions Precautions: Knee;Fall Precaution Comments: Instructed on proper application and use esp for stairs  Required Braces or Orthoses: Knee Immobilizer - Right Knee Immobilizer - Right: Discontinue once straight leg raise with < 10 degree lag Restrictions Weight Bearing Restrictions: No Other Position/Activity Restrictions: WBAT    Mobility  Bed Mobility Overal bed mobility: Needs Assistance Bed Mobility: Supine to Sit     Supine to sit: Supervision     General bed mobility comments: Pt self assisting L LE with UEs  Transfers Overall transfer level: Needs assistance Equipment used: Rolling walker (2 wheeled) Transfers: Sit to/from Stand Sit to Stand: Min guard         General transfer comment: for safety. Cues for UE placement and LE management  Ambulation/Gait Ambulation/Gait assistance: Min guard Ambulation Distance (Feet): 75 Feet Assistive device: Rolling walker (2 wheeled) Gait Pattern/deviations: Decreased step length - right;Decreased step length - left;Shuffle;Trunk flexed;Step-to pattern;Step-through pattern Gait velocity: decr   General Gait Details: cues for sequence, posture and position from [employer] Mobility     Modified Rankin (Stroke Patients Only)       Balance                                            Cognition Arousal/Alertness: Awake/alert Behavior During Therapy: WFL for tasks assessed/performed Overall Cognitive Status: Within Functional Limits for tasks assessed                                        Exercises   Total Knee Replacement TE's 10 reps B LE ankle pumps 10 reps towel squeezes 10 reps knee presses 10 reps heel slides  10 reps SAQ's 10 reps SLR's 10 reps ABD Followed by ICE     General Comments        Pertinent Vitals/Pain Pain Assessment: 0-10 Pain Score: 5  Pain Location: R knee/thigh Pain Descriptors / Indicators: Aching;Sore;Operative site guarding Pain Intervention(s): Monitored during session;Repositioned;Ice applied;Premedicated before session    Home Living                      Prior Function            PT Goals (current goals can now be found in the care plan section) Progress towards PT goals: Progressing toward goals    Frequency    7X/week      PT Plan Current plan remains  appropriate    Co-evaluation              AM-PAC PT "6 Clicks" Daily Activity  Outcome Measure  Difficulty turning over in bed (including adjusting bedclothes, sheets and blankets)?: A Lot Difficulty moving from lying on back to sitting on the side of the bed? : A Lot Difficulty sitting down on and standing up from a chair with arms (e.g., wheelchair, bedside commode, etc,.)?: A Lot Help needed moving to and from a bed to chair (including a wheelchair)?: A Little Help needed walking in hospital room?: A Little Help needed climbing 3-5 steps with a railing? : A Little 6 Click Score: 15    End of Session Equipment Utilized During Treatment: Gait belt Activity Tolerance: Patient tolerated treatment well Patient left: in chair;with call bell/phone within reach;with family/visitor present Nurse  Communication: Mobility status PT Visit Diagnosis: Difficulty in walking, not elsewhere classified (R26.2)     Time: 2831-5176 PT Time Calculation (min) (ACUTE ONLY): 24 min  Charges:  $Gait Training: 8-22 mins $Therapeutic Exercise: 8-22 mins                    G Codes:       Rica Koyanagi  PTA [facility]  Acute  Rehab Pager      [phone]

## 2017-10-24 NOTE — Discharge Summary (Addendum)
Physician Discharge Summary  Patient ID: Lawrence [name] MRN: [id number] DOB/AGE: 07-18-1956 61 y.o.  Admit date: 10/22/2017 Discharge date: 10/24/2017  Admission Diagnoses:  Right knee DJD  Discharge Diagnoses:  Principal Problem:   Right knee DJD   Past Medical History:  Diagnosis Date  . Acute renal insufficiency 2016   history of  . Anemia    iron deficiency  . Arthritis    osteoarthritis both knees  . Bilateral cataracts   . Cancer ([place])    kidney  . Cellulitis    leg  . Chronic kidney disease 2016   bil renal mass, CKD stage 3  . Colon polyp   . Diabetes mellitus without complication ([place])    type II  . Dyslipidemia   . Hypertension   . Non-traumatic rhabdomyolysis    during surgery  . Renal cyst 2016   bilateral  . 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 Sleep apnea    uses c-pap machine  . Varicose veins of legs   . Vitamin B 12 deficiency     Surgeries: Procedure(s): RIGHT TOTAL KNEE ARTHROPLASTY on 10/22/2017   Consultants (if any):   Discharged Condition: Improved  Hospital Course: Lawrence [name] is an 61 y.o. male who was admitted 10/22/2017 with a diagnosis of Right knee DJD and went to the operating room on 10/22/2017 and underwent the above named procedures.    He was given perioperative antibiotics:  Anti-infectives (From admission, onward)   Start     Dose/Rate Route Frequency Ordered Stop   10/22/17 1400  ceFAZolin (ANCEF) 3 g in dextrose 5 % 50 mL IVPB     3 g 130 mL/hr over 30 Minutes Intravenous Every 6 hours 10/22/17 1122 10/23/17 0309   10/22/17 1000  clindamycin (CLEOCIN) IVPB 900 mg     900 mg 100 mL/hr over 30 Minutes Intravenous  Once 10/22/17 0945 10/22/17 0808   10/22/17 0803  polymyxin B 500,000 Units, bacitracin 50,000 Units in sodium chloride 0.9 % 500 mL irrigation  Status:  Discontinued       As needed 10/22/17 0806 10/22/17 1014   10/22/17  0716  clindamycin (CLEOCIN) 900 MG/50ML IVPB    Comments:  Chrystine Oiler: cabinet override      10/22/17 0716 10/22/17 0738   10/22/17 0600  ceFAZolin (ANCEF) 3 g in dextrose 5 % 50 mL IVPB     3 g 130 mL/hr over 30 Minutes Intravenous On call to O.R. 10/21/17 1116 10/22/17 0801    .  He was given sequential compression devices, early ambulation, and ASA for DVT prophylaxis.  He benefited maximally from the hospital stay and there were no complications.    Recent vital signs:  Vitals:   10/23/17 2115 10/24/17 0740  BP:  (!) 142/58  Pulse: 78 86  Resp: 19   Temp:    SpO2: 99%     Recent laboratory studies:  Lab Results  Component Value Date   HGB 13.8 10/24/2017   HGB 13.7 10/23/2017   HGB 15.4 10/19/2017   Lab Results  Component Value Date   WBC PENDING 10/24/2017   PLT 198 10/24/2017   Lab Results  Component Value Date   INR 1.06 10/19/2017   Lab Results  Component Value Date   NA 136 10/23/2017   K 4.0 10/23/2017   CL 104 10/23/2017   CO2 26 10/23/2017   BUN 23 (H) 10/23/2017   CREATININE  1.20 10/23/2017   GLUCOSE 135 (H) 10/23/2017    Discharge Medications:   Allergies as of 10/24/2017   No Known Allergies     Medication List    STOP taking these medications   acetaminophen 500 MG tablet Commonly known as:  TYLENOL   acetaminophen 650 MG CR tablet Commonly known as:  TYLENOL   amoxicillin 500 MG capsule Commonly known as:  AMOXIL   cefdinir 300 MG capsule Commonly known as:  OMNICEF   phenazopyridine 200 MG tablet Commonly known as:  PYRIDIUM     TAKE these medications   amLODipine 5 MG tablet Commonly known as:  NORVASC Take 5 mg by mouth daily.   ANDROGEL PUMP 20.25 MG/ACT (1.62%) Gel Generic drug:  Testosterone Apply topically daily. Apply 2 pumps on each shoulder once daily   aspirin EC 325 MG tablet Take 1 tablet (325 mg total) by mouth 2 (two) times daily.   docusate sodium 100 MG capsule Commonly known as:   COLACE Take 1 capsule (100 mg total) by mouth 2 (two) times daily.   fenofibrate 160 MG tablet Take 160 mg by mouth daily.   ferrous sulfate 325 (65 FE) MG EC tablet Take 325 mg by mouth daily with breakfast.   KEPPRA XR 750 MG Tb24 Generic drug:  Levetiracetam Take 3,000 mg by mouth daily.   methocarbamol 500 MG tablet Commonly known as:  ROBAXIN Take 1 tablet (500 mg total) by mouth every 6 (six) hours as needed for muscle spasms.   metoprolol succinate 50 MG 24 hr tablet Commonly known as:  TOPROL-XL Take 50 mg by mouth every morning. Take with or immediately following a meal   omega-3 acid ethyl esters 1 g capsule Commonly known as:  LOVAZA Take 1 g by mouth daily. Reported on 11/27/2015   ONE TOUCH ULTRA 2 w/Device Kit   OSTEO BI-FLEX TRIPLE STRENGTH Tabs Take 2 tablets by mouth daily.   oxyCODONE-acetaminophen 10-325 MG tablet Commonly known as:  PERCOCET Take 1-2 tablets by mouth every 4 (four) hours as needed for pain (severe).   polyethylene glycol packet Commonly known as:  MIRALAX / GLYCOLAX Take 17 g by mouth daily.   PRENATAL-U 106.5-1 MG Caps Take 1 capsule by mouth daily.   sitaGLIPtin 100 MG tablet Commonly known as:  JANUVIA Take 100 mg by mouth daily.   traMADol 50 MG tablet Commonly known as:  ULTRAM Take 1-2 tablets (50-100 mg total) by mouth every 6 (six) hours as needed for moderate pain.   vitamin B-12 100 MCG tablet Commonly known as:  CYANOCOBALAMIN Take 100 mcg by mouth daily.   vitamin C 500 MG tablet Commonly known as:  ASCORBIC ACID Take 500 mg by mouth daily.            Durable Medical Equipment  (From admission, onward)        Start     Ordered   10/23/17 1122  For home use only DME Bedside commode  Once    Question:  Patient needs a bedside commode to treat with the following condition  Answer:  Surgery, elective   10/23/17 1121      Diagnostic Studies: Dg Knee Right Port  Result Date: 10/22/2017 CLINICAL  DATA:  Postop right knee replacement EXAM: PORTABLE RIGHT KNEE - 1-2 VIEW COMPARISON:  None. FINDINGS: Changes of right knee replacement. No hardware or bony complicating feature. Soft tissue and joint space gas noted. IMPRESSION: Right knee replacement.  No visible complicating feature. Electronically  Signed   By: Rolm Baptise M.D.   On: 10/22/2017 11:01    Disposition: 01-Home or Self Care  Discharge Instructions    Call MD / Call 911   Complete by:  As directed    If you experience chest pain or shortness of breath, CALL 911 and be transported to the hospital emergency room.  If you develope a fever above 101 F, pus (white drainage) or increased drainage or redness at the wound, or calf pain, call your surgeon's office.   Constipation Prevention   Complete by:  As directed    Drink plenty of fluids.  Prune juice may be helpful.  You may use a stool softener, such as Colace (over the counter) 100 mg twice a day.  Use MiraLax (over the counter) for constipation as needed.   Diet - low sodium heart healthy   Complete by:  As directed    Do not put a pillow under the knee. Place it under the heel.   Complete by:  As directed    Driving restrictions   Complete by:  As directed    No driving for 6 weeks   Increase activity slowly as tolerated   Complete by:  As directed    Lifting restrictions   Complete by:  As directed    No lifting for 6 weeks   TED hose   Complete by:  As directed    Use stockings (TED hose) for 2 weeks on both leg(s).  You may remove them at night for sleeping.      Follow-up Information    Susa Day, MD Follow up in 2 week(s).   Specialty:  Orthopedic Surgery Contact information: [address] [place] [address] [phone]            Signed: [facility] 10/24/2017, 8:24 AM

## 2017-10-24 NOTE — Progress Notes (Signed)
   Subjective:  Patient reports pain as mild to moderate.  Denies N/V/CP/SOB.  Objective:   VITALS:   Vitals:   10/23/17 1518 10/23/17 2020 10/23/17 2115 10/24/17 0740  BP: (!) 152/63 137/62  (!) 142/58  Pulse: 62 74 78 86  Resp: 18 20 19    Temp: 99.8 F (37.7 C) 99.5 F (37.5 C)    TempSrc: Oral Oral    SpO2: 95% 100% 99%   Weight:      Height:        NAD ABD soft Sensation intact distally Intact pulses distally Dorsiflexion/Plantar flexion intact Incision: dressing C/D/I Compartment soft   Lab Results  Component Value Date   WBC PENDING 10/24/2017   HGB 13.8 10/24/2017   HCT 40.7 10/24/2017   MCV 87.2 10/24/2017   PLT 198 10/24/2017   BMET    Component Value Date/Time   NA 136 10/23/2017 0545   K 4.0 10/23/2017 0545   CL 104 10/23/2017 0545   CO2 26 10/23/2017 0545   GLUCOSE 135 (H) 10/23/2017 0545   BUN 23 (H) 10/23/2017 0545   CREATININE 1.20 10/23/2017 0545   CREATININE 1.37 (H) 11/22/2015 1459   CALCIUM 8.4 (L) 10/23/2017 0545   GFRNONAA >60 10/23/2017 0545   GFRNONAA 57 (L) 11/22/2015 1459   GFRAA >60 10/23/2017 0545   GFRAA 66 11/22/2015 1459     Assessment/Plan: 2 Days Post-Op   Active Problems:   Right knee DJD   WBAT with walker ASA, SCDs, TEDs PO pain control D/C home with outpatient PT   Lawrence [name] 10/24/2017, 8:21 AM   Rod Can, MD Cell [phone]

## 2017-11-17 ENCOUNTER — Ambulatory Visit: Payer: BLUE CROSS/BLUE SHIELD | Admitting: Neurology

## 2017-11-17 ENCOUNTER — Encounter: Payer: Self-pay | Admitting: Neurology

## 2017-11-17 VITALS — BP 146/72 | HR 66 | Resp 10 | Ht 72.0 in | Wt 323.0 lb

## 2017-11-17 DIAGNOSIS — Q282 Arteriovenous malformation of cerebral vessels: Secondary | ICD-10-CM | POA: Diagnosis not present

## 2017-11-17 DIAGNOSIS — G40209 Localization-related (focal) (partial) symptomatic epilepsy and epileptic syndromes with complex partial seizures, not intractable, without status epilepticus: Secondary | ICD-10-CM | POA: Diagnosis not present

## 2017-11-17 NOTE — Patient Instructions (Signed)
Great meeting you! Continue with current plan for now. Follow-up as needed, call for any changes.  Seizure Precautions: 1. If medication has been prescribed for you to prevent seizures, take it exactly as directed.  Do not stop taking the medicine without talking to your doctor first, even if you have not had a seizure in a long time.   2. Avoid activities in which a seizure would cause danger to yourself or to others.  Don't operate dangerous machinery, swim alone, or climb in high or dangerous places, such as on ladders, roofs, or girders.  Do not drive unless your doctor says you may.  3. If you have any warning that you may have a seizure, lay down in a safe place where you can't hurt yourself.    4.  No driving for 6 months from last seizure, as per [facility].   Please refer to the following link on the [facility] website for more information: http://[email or web address]/[email or web address]/drivingu.cfm   5.  Maintain good sleep hygiene. Avoid alcohol.  6.  Contact your doctor if you have any problems that may be related to the medicine you are taking.  7.  Call 911 and bring the patient back to the ED if:        A.  The seizure lasts longer than 5 minutes.       B.  The patient doesn't awaken shortly after the seizure  C.  The patient has new problems such as difficulty seeing, speaking or moving  D.  The patient was injured during the seizure  E.  The patient has a temperature over 102 F (39C)  F.  The patient vomited and now is having trouble breathing

## 2017-11-17 NOTE — Progress Notes (Signed)
NEUROLOGY CONSULTATION NOTE  Lawrence [name] MRN: [id number] DOB: [birth year]  Referring provider: Dr. Louis Meckel Primary care provider: Dr. Nelda Bucks   Reason for consult:  Second opinion regarding seizures  Dear Dr. Louis Meckel:  Thank you for your kind referral of Lawrence [name] for consultation of the above symptoms. Although his history is well known to you, please allow me to reiterate it for the purpose of our medical record. The patient was accompanied to the clinic by his wife who also provides collateral information. Records and images were personally reviewed where available.  HISTORY OF PRESENT ILLNESS: This is a pleasant 62 year old right-handed man with a history of hypertension, diabetes, renal cell CA, and seizures since August 2018. The first seizure was witnessed by his friend, he recalls it was hard for him to hear, everything was muffled, then he passed out. He woke up the next day in the hospital with no focal weakness, his thought process was a little jumbled. He denied any headaches or dizziness. [facility] from [facility] were reviewed, he had a second seizure in the ER, I cannot find a description but reported as generalized tonic-clonic. Brain imaging with CTA showed a possible left temporoparietal AVM or dural aVF. He was unable to tolerate an inpatient MRI, diffusion sequence was negative for acute infarct. He had a prolonged 20-hour EEG which showed diffuse slowing. He was discharged home on Keppra 1040m BID. He recalls that he may have struggled for a word but this improved as he got home. On 06/29/17, he was having dinner then had difficulty speaking, he could not formulate a sentence. His wife reports that he could repeat but when asked to tell her his name, he gave her name instead. His sentences and grammar were wrong and not pertinent to the situation, and he had a glazed look in his eyes but could follow commands. He states he walked into  the ER and did not notice any focal weakness. He was admitted and had an MRI brain then subsequent cerebral angiogram which showed a left temporal parietal dural AV fistula, that was then embolized with Onyx on 06/30/17. He reports aphasia lasted 2-3 hours, and he was discharged home on Keppra 15036mBID. He denies any further seizures or seizure-like symptoms since 06/30/17, but had fatigue and insomnia on the Keppra. He was switched to extended-release Keppra 300026maily. He feels the fatigue and insomnia are better, but he still feels chronically fatigued. He denies any further word-finding difficulties, he feels his thought process is back to normal. He gets irritable sometimes. He is scheduled for a repeat angiogram in March 2019.   He denies any olfactory/gustatory hallucinations, deja vu, rising epigastric sensation, focal numbness/tingling/weakness, myoclonic jerks. He denies any headaches, dizziness, diplopia, dysarthria/dysphagia, neck/back pain, bowel dysfunction. Since his right knee replacement 3 weeks ago, he has noticed urinary frequency.   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 Otherwise he had a normal birth and early development.  There is no history of febrile convulsions, CNS infections such as meningitis/encephalitis, significant traumatic brain injury, or family history of seizures.  PAST MEDICAL HISTORY: Past Medical History:  Diagnosis Date  . Acute renal insufficiency 2016   history of  . Anemia    iron deficiency  . Arthritis    osteoarthritis both knees  . Bilateral cataracts   . [place]  . Cellulitis    leg  . Chronic kidney disease 2016  bil renal mass, CKD stage 3  . Colon polyp   . Diabetes mellitus without complication ([place])    type II  . Dyslipidemia   . Hypertension   . Non-traumatic rhabdomyolysis    during surgery  . Renal cyst 2016   bilateral  . 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 Sleep apnea    uses c-pap machine  . Varicose veins of legs   . Vitamin B 12 deficiency     PAST SURGICAL HISTORY: Past Surgical History:  Procedure Laterality Date  . CEREBRAL ANGIOGRAM     AVM repair  . COLONOSCOPY  07/25/2016   Dr. Lyda Jester  . CYSTOSCOPY WITH RETROGRADE PYELOGRAM, URETEROSCOPY AND STENT PLACEMENT Right 05/02/2016   Procedure: CYSTO WITH BILATERAL RETROGRADE PYELOGRAM, RIGHT URETEROSCOPY, RIGHT URETERAL DILATION, RIGHT STENT PLACEMENT,  AND BRUSH BIOPSY;  Surgeon: Ardis Hughs, MD;  Location: [facility];  Service: Urology;  Laterality: Right;  . left GSV laser ablation    . PILONIDAL CYST EXCISION  1998  . ROBOTIC ASSITED PARTIAL NEPHRECTOMY Right 09/07/2015   Procedure: RIGHT ROBOTIC ASSITED PARTIAL NEPHRECTOMY;  Surgeon: Ardis Hughs, MD;  Location: [facility];  Service: Urology;  Laterality: Right;  . TONSILLECTOMY AND ADENOIDECTOMY  1964  . TOTAL KNEE ARTHROPLASTY Right 10/22/2017   Procedure: RIGHT TOTAL KNEE ARTHROPLASTY;  Surgeon: Susa Day, MD;  Location: [facility];  Service: Orthopedics;  Laterality: Right;  Adductor Block    MEDICATIONS: Current Outpatient Medications on File Prior to Visit  Medication Sig Dispense Refill  . amLODipine (NORVASC) 5 MG tablet Take 5 mg by mouth daily.    . ANDROGEL PUMP 20.25 MG/ACT (1.62%) GEL Apply topically daily. Apply 2 pumps on each shoulder once daily    . aspirin EC 325 MG tablet Take 1 tablet (325 mg total) by mouth 2 (two) times daily. 60 tablet 1  . Blood Glucose Monitoring Suppl (ONE TOUCH ULTRA 2) W/DEVICE KIT     . docusate sodium (COLACE) 100 MG capsule Take 1 capsule (100 mg total) by mouth 2 (two) times daily. 60 capsule 2  . fenofibrate 160 MG tablet Take 160 mg by mouth daily.     . ferrous sulfate 325 (65 FE) MG EC tablet Take 325 mg by mouth daily with breakfast.    . Levetiracetam (KEPPRA XR) 750 MG TB24 Take 3,000 mg by mouth daily.      . methocarbamol (ROBAXIN) 500 MG tablet Take 1 tablet (500 mg total) by mouth every 6 (six) hours as needed for muscle spasms. 40 tablet 1  . metoprolol succinate (TOPROL-XL) 50 MG 24 hr tablet Take 50 mg by mouth every morning. Take with or immediately following a meal    . Misc Natural Products (OSTEO BI-FLEX TRIPLE STRENGTH) TABS Take 2 tablets by mouth daily.    Marland Kitchen omega-3 acid ethyl esters (LOVAZA) 1 G capsule Take 1 g by mouth daily. Reported on 11/27/2015    . oxyCODONE-acetaminophen (PERCOCET) 10-325 MG tablet Take 1-2 tablets by mouth every 4 (four) hours as needed for pain (severe). 40 tablet 0  . polyethylene glycol (MIRALAX / GLYCOLAX) packet Take 17 g by mouth daily. 14 each 0  . Prenatal w/o A Vit-Fe Fum-FA (PRENATAL-U) 106.5-1 MG CAPS Take 1 capsule by mouth daily.     . sitaGLIPtin (JANUVIA) 100 MG tablet Take 100 mg by mouth daily.    . vitamin B-12 (CYANOCOBALAMIN) 100 MCG tablet Take 100  mcg by mouth daily.    . vitamin C (ASCORBIC ACID) 500 MG tablet Take 500 mg by mouth daily.     No current facility-administered medications on file prior to visit.     ALLERGIES: No Known Allergies  FAMILY HISTORY: Family History  Problem Relation Age of Onset  . Cancer Father        sinus tumor / then prostate concer / bone cancer  . Hyperlipidemia Brother     SOCIAL HISTORY: Social History   Socioeconomic History  . Marital status: Married    Spouse name: Not on file  . Number of children: Not on file  . Years of education: Not on file  . Highest education level: Not on file  Social Needs  . Financial resource strain: Not on file  . Food insecurity - worry: Not on file  . Food insecurity - inability: Not on file  . Transportation needs - medical: Not on file  . Transportation needs - non-medical: Not on file  Occupational History  . Not on file  Tobacco Use  . Smoking status: Former Smoker    Last attempt to quit: 12/04/2004    Years since quitting: 12.9  . Smokeless  tobacco: Never Used  Substance and Sexual Activity  . Alcohol use: Yes    Comment: "once in a blue moon" social drinker  . Drug use: No  . Sexual activity: Not on file  Other Topics Concern  . Not on file  Social History Narrative  . Not on file    REVIEW OF SYSTEMS: Constitutional: No fevers, chills, or sweats, no generalized fatigue, change in appetite Eyes: No visual changes, double vision, eye pain Ear, nose and throat: No hearing loss, ear pain, nasal congestion, sore throat Cardiovascular: No chest pain, palpitations Respiratory:  No shortness of breath at rest or with exertion, wheezes GastrointestinaI: No nausea, vomiting, diarrhea, abdominal pain, fecal incontinence Genitourinary:  No dysuria, urinary retention or frequency Musculoskeletal:  No neck pain, back pain Integumentary: No rash, pruritus, skin lesions Neurological: as above Psychiatric: No depression, insomnia, anxiety Endocrine: No palpitations, fatigue, diaphoresis, mood swings, change in appetite, change in weight, increased thirst Hematologic/Lymphatic:  No anemia, purpura, petechiae. Allergic/Immunologic: no itchy/runny eyes, nasal congestion, recent allergic reactions, rashes  PHYSICAL EXAM: Vitals:   11/17/17 0850  BP: (!) 146/72  Pulse: 66  Resp: 10   General: No acute distress Head:  Normocephalic/atraumatic Eyes: Fundoscopic exam shows bilateral sharp discs, no vessel changes, exudates, or hemorrhages Neck: supple, no paraspinal tenderness, full range of motion Back: No paraspinal tenderness Heart: regular rate and rhythm Lungs: Clear to auscultation bilaterally. Vascular: No carotid bruits. Skin/Extremities: No rash, no edema. Well-healing scar on right knee Neurological Exam: Mental status: alert and oriented to person, place, and time, no dysarthria or aphasia, Fund of knowledge is appropriate.  Recent and remote memory are intact. 3/3 delayed recall.  Attention and concentration are  normal.    Able to name objects and repeat phrases. Cranial nerves: CN I: not tested CN II: pupils equal, round and reactive to light, visual fields intact, fundi unremarkable. CN III, IV, VI:  full range of motion, no nystagmus, no ptosis CN V: facial sensation intact CN VII: upper and lower face symmetric CN VIII: hearing intact to finger rub CN IX, X: gag intact, uvula midline CN XI: sternocleidomastoid and trapezius muscles intact CN XII: tongue midline Bulk & Tone: normal, no fasciculations. Motor: 5/5 throughout with no pronator drift. Sensation: intact to light  touch, cold, pin. Decreased vibration sense to left ankle. No extinction to double simultaneous stimulation.  Romberg test negative Deep Tendon Reflexes: +1 throughout, no ankle clonus Plantar responses: downgoing bilaterally Cerebellar: no incoordination on finger to nose, heel to shin. No dysdiadochokinesia Gait: narrow-based and steady, able to tandem walk adequately. 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 He underwent embolization of the fistula last 06/30/17, and denies any further convulsions or aphasic episodes since 06/29/17. He is on Levetiracetam ER 3073m daily and feels fatigued, much better though than when he was on immediate-release formulation. He presents today for a second opinion and had several questions regarding the need for medication since he has not had any further seizures and since he had treatment for the AV fistula. We discussed the risks of recurrent seizures based on his history with underlying structural abnormality, and the current recommendation to continue AED treatment. We discussed goal of no seizures, no side effects, and that if side effects are causing more issues with quality of life, there are other options for  AEDs that he can switch to without the potential side effects that Keppra has. All their questions were answered, he will follow-up with his neurologist as scheduled in February. [place] driving laws were discussed with the patient, and he knows to stop driving after a seizure, until 6 months seizure-free. He will follow-up on an as needed basis and knows to call for any changes.   Thank you for allowing me to participate in the care of this patient. Please do not hesitate to call for any questions or concerns.   KEllouise Newer M.D.  CC: Dr. HLouis Meckel Dr. SDelena Bali

## 2017-11-20 ENCOUNTER — Encounter: Payer: Self-pay | Admitting: Neurology

## 2017-12-03 DIAGNOSIS — Z96659 Presence of unspecified artificial knee joint: Secondary | ICD-10-CM | POA: Insufficient documentation

## 2017-12-18 ENCOUNTER — Ambulatory Visit: Payer: Self-pay | Admitting: Orthopedic Surgery

## 2017-12-23 ENCOUNTER — Encounter (HOSPITAL_COMMUNITY): Payer: Self-pay

## 2017-12-23 NOTE — Pre-Procedure Instructions (Signed)
Last office note from Dr. Lucilla Lame 11/21/2017 in hard chart Hgb A1C (5.6) 11/23/2017 in hard chart

## 2017-12-23 NOTE — Patient Instructions (Addendum)
Your procedure is scheduled on: Thursday, Feb. 28, 2019   Surgery Time:  10:00AM-12:15PM   Report to [name]  Entrance    Report to admitting at  7:30 AM   Call this number if you have problems the morning of surgery [phone]   Do not eat food or drink liquids :After Midnight.   Do NOT smoke after Midnight   Take these medicines the morning of surgery with A SIP OF WATER: Amlodipine, Fenofibrate, Keppra, Metoprolol   DO NOT TAKE ANY DIABETIC MEDICATIONS DAY OF YOUR SURGERY                               You may not have any metal on your body including jewelry, and body piercings             Do not wear lotions, powders, perfumes/cologne, or deodorant                           Men may shave face and neck.   Do not bring valuables to the hospital. [place].   Contacts, dentures or bridgework may not be worn into surgery.   Leave suitcase in the car. After surgery it may be brought to your room.   Special Instructions: Bring a copy of your healthcare power of attorney and living will documents         the day of surgery if you haven't scanned them in before.              Please read over the following fact sheets you were given:  [facility] - Preparing for Surgery Before surgery, you can play an important role.  Because skin is not sterile, your skin needs to be as free of germs as possible.  You can reduce the number of germs on your skin by washing with CHG (chlorahexidine gluconate) soap before surgery.  CHG is an antiseptic cleaner which kills germs and bonds with the skin to continue killing germs even after washing. Please DO NOT use if you have an allergy to CHG or antibacterial soaps.  If your skin becomes reddened/irritated stop using the CHG and inform your nurse when you arrive at Short Stay. Do not shave (including legs and underarms) for at least 48 hours prior to the first CHG shower.  You  may shave your face/neck.  Please follow these instructions carefully:  1.  Shower with CHG Soap the night before surgery and the  morning of surgery.  2.  If you choose to wash your hair, wash your hair first as usual with your normal  shampoo.  3.  After you shampoo, rinse your hair and body thoroughly to remove the shampoo.                             4.  Use CHG as you would any other liquid soap.  You can apply chg directly to the skin and wash.  Gently with a scrungie or clean washcloth.  5.  Apply the CHG Soap to your body ONLY FROM THE NECK DOWN.   Do   not use on face/ open  Wound or open sores. Avoid contact with eyes, ears mouth and   genitals (private parts).                       Wash face,  Genitals (private parts) with your normal soap.             6.  Wash thoroughly, paying special attention to the area where your    surgery  will be performed.  7.  Thoroughly rinse your body with warm water from the neck down.  8.  DO NOT shower/wash with your normal soap after using and rinsing off the CHG Soap.                9.  Pat yourself dry with a clean towel.            10.  Wear clean pajamas.            11.  Place clean sheets on your bed the night of your first shower and do not  sleep with pets. Day of Surgery : Do not apply any lotions/deodorants the morning of surgery.  Please wear clean clothes to the hospital/surgery center.  FAILURE TO FOLLOW THESE INSTRUCTIONS MAY RESULT IN THE CANCELLATION OF YOUR SURGERY  PATIENT SIGNATURE_________________________________  NURSE SIGNATURE__________________________________  ________________________________________________________________________   [name]  An incentive spirometer is a tool that can help keep your lungs clear and active. This tool measures how well you are filling your lungs with each breath. Taking long deep breaths may help reverse or decrease the chance of developing breathing  (pulmonary) problems (especially infection) following:  A long period of time when you are unable to move or be active. BEFORE THE PROCEDURE   If the spirometer includes an indicator to show your best effort, your nurse or respiratory therapist will set it to a desired goal.  If possible, sit up straight or lean slightly forward. Try not to slouch.  Hold the incentive spirometer in an upright position. INSTRUCTIONS FOR USE  1. Sit on the edge of your bed if possible, or sit up as far as you can in bed or on a chair. 2. Hold the incentive spirometer in an upright position. 3. Breathe out normally. 4. Place the mouthpiece in your mouth and seal your lips tightly around it. 5. Breathe in slowly and as deeply as possible, raising the piston or the ball toward the top of the column. 6. Hold your breath for 3-5 seconds or for as long as possible. Allow the piston or ball to fall to the bottom of the column. 7. Remove the mouthpiece from your mouth and breathe out normally. 8. Rest for a few seconds and repeat Steps 1 through 7 at least 10 times every 1-2 hours when you are awake. Take your time and take a few normal breaths between deep breaths. 9. The spirometer may include an indicator to show your best effort. Use the indicator as a goal to work toward during each repetition. 10. After each set of 10 deep breaths, practice coughing to be sure your lungs are clear. If you have an incision (the cut made at the time of surgery), support your incision when coughing by placing a pillow or rolled up towels firmly against it. Once you are able to get out of bed, walk around indoors and cough well. You may stop using the incentive spirometer when instructed by your caregiver.  RISKS AND COMPLICATIONS  Take your time  so you do not get dizzy or light-headed.  If you are in pain, you may need to take or ask for pain medication before doing incentive spirometry. It is harder to take a deep breath if you  are having pain. AFTER USE  Rest and breathe slowly and easily.  It can be helpful to keep track of a log of your progress. Your caregiver can provide you with a simple table to help with this. If you are using the spirometer at home, follow these instructions: [name] IF:   You are having difficultly using the spirometer.  You have trouble using the spirometer as often as instructed.  Your pain medication is not giving enough relief while using the spirometer.  You develop fever of 100.5 F (38.1 C) or higher. SEEK IMMEDIATE MEDICAL CARE IF:   You cough up bloody sputum that had not been present before.  You develop fever of 102 F (38.9 C) or greater.  You develop worsening pain at or near the incision site. MAKE SURE YOU:   Understand these instructions.  Will watch your condition.  Will get help right away if you are not doing well or get worse. Document Released: 03/02/2007 Document Revised: 01/12/2012 Document Reviewed: 05/03/2007 ExitCare Patient Information 2014 Exit[employer], [place].   ________________________________________________________________________  WHAT IS A BLOOD TRANSFUSION? Blood Transfusion Information  A transfusion is the replacement of blood or some of its parts. Blood is made up of multiple cells which provide different functions.  Red blood cells carry oxygen and are used for blood loss replacement.  White blood cells fight against infection.  Platelets control bleeding.  Plasma helps clot blood.  Other blood products are available for specialized needs, such as hemophilia or other clotting disorders. BEFORE THE TRANSFUSION  Who gives blood for transfusions?   Healthy volunteers who are fully evaluated to make sure their blood is safe. This is blood bank blood. Transfusion therapy is the safest it has ever been in the practice of medicine. Before blood is taken from a donor, a complete history is taken to make sure that person has  no history of diseases nor engages in risky social behavior (examples are intravenous drug use or sexual activity with multiple partners). The donor's travel history is screened to minimize risk of transmitting infections, such as malaria. The donated blood is tested for signs of infectious diseases, such as HIV and hepatitis. The blood is then tested to be sure it is compatible with you in order to minimize the chance of a transfusion reaction. If you or a relative donates blood, this is often done in anticipation of surgery and is not appropriate for emergency situations. It takes many days to process the donated blood. RISKS AND COMPLICATIONS Although transfusion therapy is very safe and saves many lives, the main dangers of transfusion include:   Getting an infectious disease.  Developing a transfusion reaction. This is an allergic reaction to something in the blood you were given. Every precaution is taken to prevent this. The decision to have a blood transfusion has been considered carefully by your caregiver before blood is given. Blood is not given unless the benefits outweigh the risks. AFTER THE TRANSFUSION  Right after receiving a blood transfusion, you will usually feel much better and more energetic. This is especially true if your red blood cells have gotten low (anemic). The transfusion raises the level of the red blood cells which carry oxygen, and this usually causes an energy increase.  The  nurse administering the transfusion will monitor you carefully for complications. HOME CARE INSTRUCTIONS  No special instructions are needed after a transfusion. You may find your energy is better. Speak with your caregiver about any limitations on activity for underlying diseases you may have. SEEK MEDICAL CARE IF:   Your condition is not improving after your transfusion.  You develop redness or irritation at the intravenous (IV) site. SEEK IMMEDIATE MEDICAL CARE IF:  Any of the following  symptoms occur over the next 12 hours:  Shaking chills.  You have a temperature by mouth above 102 F (38.9 C), not controlled by medicine.  Chest, back, or muscle pain.  People around you feel you are not acting correctly or are confused.  Shortness of breath or difficulty breathing.  Dizziness and fainting.  You get a rash or develop hives.  You have a decrease in urine output.  Your urine turns a dark color or changes to pink, red, or brown. Any of the following symptoms occur over the next 10 days:  You have a temperature by mouth above 102 F (38.9 C), not controlled by medicine.  Shortness of breath.  Weakness after normal activity.  The white part of the eye turns yellow (jaundice).  You have a decrease in the amount of urine or are urinating less often.  Your urine turns a dark color or changes to pink, red, or brown. Document Released: 10/17/2000 Document Revised: 01/12/2012 Document Reviewed: 06/05/2008 [facility] Patient Information 2014 [place], [place].  _______________________________________________________________________

## 2017-12-25 ENCOUNTER — Ambulatory Visit: Payer: Self-pay | Admitting: Orthopedic Surgery

## 2017-12-25 NOTE — H&P (View-Only) (Signed)
Lawrence [name] is an 62 y.o. male.   Chief Complaint: L knee pain HPI: The patient is here today for his H & P. His surgery is scheduled for 12/31/2017 at [facility]. He is having a left total knee replacement by Dr. Susa Day.  Dr. Tonita Cong and the patient mutually agreed to proceed with a left total knee replacement. Risks and benefits of the procedure were discussed including stiffness, suboptimal range of motion, persistent pain, infection requiring removal of prosthesis and reinsertion, need for prophylactic antibiotics in the future, for example, dental procedures, possible need for manipulation, revision in the future and also anesthetic complications including DVT, PE, etc. We discussed the perioperative course, time in the hospital, postoperative recovery and the need for elevation to control swelling. We also discussed the predicted range of motion and the probability that squatting and kneeling would be unobtainable in the future. In addition, postoperative anticoagulation was discussed. We have obtained preoperative medical clearance as necessary. Provided illustrated handout and discussed it in detail. They will enroll in the total joint replacement educational forum at the hospital.  He also reports since his right total knee replacement 9 weeks ago he has had some right lateral hip pain which does seem to bother him when he lays on that side at night as well as when he starts moving. Generally as long as he is active the hip does not bother him.  Past Medical History:  Diagnosis Date  . Acute renal insufficiency 2016   history of  . Anemia    iron deficiency  . Arthritis    osteoarthritis both knees  . Bilateral cataracts   . Cancer ([place])    kidney  . Cellulitis    leg  . 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 Chronic kidney disease 2016   bil renal mass, CKD stage 3  . Colon polyp   . Diabetes mellitus without complication ([place])    type II  . Dyslipidemia   .  Erectile dysfunction   . Hypertension   . Hypogonadism in male   . Morbid obesity ([place])   . Non-traumatic rhabdomyolysis    during surgery  . Renal cyst 2016   bilateral  . Respiratory failure ([place]) 06/20/2017  . 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 Sinusitis   . Sleep apnea    uses c-pap machine  . Varicose veins of legs   . Vitamin B 12 deficiency     Past Surgical History:  Procedure Laterality Date  . AV fistula embolization  06/2017  . CEREBRAL ANGIOGRAM     AVM repair  . COLONOSCOPY  07/25/2016   Dr. Lyda Jester  . CYSTOSCOPY WITH RETROGRADE PYELOGRAM, URETEROSCOPY AND STENT PLACEMENT Right 05/02/2016   Procedure: CYSTO WITH BILATERAL RETROGRADE PYELOGRAM, RIGHT URETEROSCOPY, RIGHT URETERAL DILATION, RIGHT STENT PLACEMENT,  AND BRUSH BIOPSY;  Surgeon: Ardis Hughs, MD;  Location: [facility];  Service: Urology;  Laterality: Right;  . left GSV laser ablation    . PILONIDAL CYST EXCISION  1998  . ROBOTIC ASSITED PARTIAL NEPHRECTOMY Right 09/07/2015   Procedure: RIGHT ROBOTIC ASSITED PARTIAL NEPHRECTOMY;  Surgeon: Ardis Hughs, MD;  Location: [facility];  Service: Urology;  Laterality: Right;  . TONSILLECTOMY AND ADENOIDECTOMY  1964  . TOTAL KNEE ARTHROPLASTY Right 10/22/2017   Procedure: RIGHT TOTAL KNEE ARTHROPLASTY;  Surgeon: Susa Day, MD;  Location: [facility];  Service: Orthopedics;  Laterality: Right;  Adductor Block  Family History  Problem Relation Age of Onset  . Cancer Father        sinus tumor / then prostate concer / bone cancer  . Hyperlipidemia Brother    Social History:  reports that he quit smoking about 13 years ago. he has never used smokeless tobacco. He reports that he drinks alcohol. He reports that he does not use drugs.  Allergies: No Known Allergies   (Not in a hospital admission)  No results found for this or any previous visit (from the past 48 hour(s)). No results  found.  Review of Systems  Constitutional: Negative.   HENT: Negative.   Eyes: Negative.   Respiratory: Negative.   Cardiovascular: Negative.   Gastrointestinal: Negative.   Genitourinary: Negative.   Musculoskeletal: Positive for joint pain.  Skin: Negative.   Neurological: Negative.   Psychiatric/Behavioral: Negative.     There were no vitals taken for this visit. Physical Exam  Constitutional: He is oriented to person, place, and time. He appears well-developed.  HENT:  Head: Normocephalic.  Eyes: Pupils are equal, round, and reactive to light.  Neck: Normal range of motion.  Cardiovascular: Normal rate.  Respiratory: Effort normal.  GI: Soft.  Musculoskeletal:  Patient is a 62 year old male.  Patient is awake, alert, oriented 3. Overweight. Slightly antalgic gait with the use of a cane.  On examination of the Left knee, tender on palpation of the medial Joint line. Nontender lateral joint line, patellar tendon, quadriceps tendon, patella, peroneal nerve and popliteal space. No calf pain or sign of DVT. No pain or laxity with varus or valgus stress. No instability noted. Painful McMurray's. Trace effusion noted. Range of motion 0-110. Positive patellofemoral crepitus. No patellofemoral pain on compression. Sensation intact distally. No erythema, ecchymosis or any rashes about the knee.  He is also tender to palpation over the right hip greater trochanter.  Prior x-rays reviewed with end-stage degenerative changes left knee medial joint space.  Neurological: He is alert and oriented to person, place, and time.     Assessment/Plan L knee primary osteoarthritis  Pt with end-stage Left knee DJD, bone-on-bone, refractory to conservative tx, scheduled for Left total knee replacement by Dr. Tonita Cong on February 28. We again discussed the procedure itself as well as risks, complications and alternatives, including but not limited to DVT, PE, infx, bleeding, failure of procedure,  need for secondary procedure including manipulation, nerve injury, ongoing pain/symptoms, anesthesia risk, even stroke or death. Also discussed typical post-op protocols, activity restrictions, need for PT, flexion/extension exercises, time out of work. Discussed need for DVT ppx post-op per protocol. Discussed dental ppx and infx prevention. Also discussed limitations post-operatively such as kneeling and squatting. All questions were answered. Patient desires to proceed with surgery as scheduled.  Will hold supplements, ASA and NSAIDs accordingly. Will remain NPO after midnight the night before surgery. Will present to [facility] for pre-op testing. Anticipate hospital stay to include at least 2 midnights given medical history and to ensure proper pain control. Plan ASA 325mg  BID for DVT ppx post-op. Plan Oxycodone, Robaxin, Colace, Miralax. We will utilize Movantik in the hospital to also decrease risk of constipation like he dealt with last time. Plan To discharge to outpatient physical therapy at [facility] outpatient like last time which he did well with. Will follow up 10-14 days post-op for Staple removal and xrays.  Plan left total knee replacement  Cecilie Kicks., PA-C for Dr. Tonita Cong 12/25/2017, 1:05 PM

## 2017-12-25 NOTE — H&P (Signed)
Lawrence [name] is an 62 y.o. male.   Chief Complaint: L knee pain HPI: The patient is here today for his H & P. His surgery is scheduled for 12/31/2017 at [facility]. He is having a left total knee replacement by Dr. Susa Day.  Dr. Tonita Cong and the patient mutually agreed to proceed with a left total knee replacement. Risks and benefits of the procedure were discussed including stiffness, suboptimal range of motion, persistent pain, infection requiring removal of prosthesis and reinsertion, need for prophylactic antibiotics in the future, for example, dental procedures, possible need for manipulation, revision in the future and also anesthetic complications including DVT, PE, etc. We discussed the perioperative course, time in the hospital, postoperative recovery and the need for elevation to control swelling. We also discussed the predicted range of motion and the probability that squatting and kneeling would be unobtainable in the future. In addition, postoperative anticoagulation was discussed. We have obtained preoperative medical clearance as necessary. Provided illustrated handout and discussed it in detail. They will enroll in the total joint replacement educational forum at the hospital.  He also reports since his right total knee replacement 9 weeks ago he has had some right lateral hip pain which does seem to bother him when he lays on that side at night as well as when he starts moving. Generally as long as he is active the hip does not bother him.  Past Medical History:  Diagnosis Date  . Acute renal insufficiency 2016   history of  . Anemia    iron deficiency  . Arthritis    osteoarthritis both knees  . Bilateral cataracts   . Cancer ([place])    kidney  . Cellulitis    leg  . 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 Chronic kidney disease 2016   bil renal mass, CKD stage 3  . Colon polyp   . Diabetes mellitus without complication ([place])    type II  . Dyslipidemia   .  Erectile dysfunction   . Hypertension   . Hypogonadism in male   . Morbid obesity ([place])   . Non-traumatic rhabdomyolysis    during surgery  . Renal cyst 2016   bilateral  . Respiratory failure ([place]) 06/20/2017  . 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 Sinusitis   . Sleep apnea    uses c-pap machine  . Varicose veins of legs   . Vitamin B 12 deficiency     Past Surgical History:  Procedure Laterality Date  . AV fistula embolization  06/2017  . CEREBRAL ANGIOGRAM     AVM repair  . COLONOSCOPY  07/25/2016   Dr. Lyda Jester  . CYSTOSCOPY WITH RETROGRADE PYELOGRAM, URETEROSCOPY AND STENT PLACEMENT Right 05/02/2016   Procedure: CYSTO WITH BILATERAL RETROGRADE PYELOGRAM, RIGHT URETEROSCOPY, RIGHT URETERAL DILATION, RIGHT STENT PLACEMENT,  AND BRUSH BIOPSY;  Surgeon: Ardis Hughs, MD;  Location: [facility];  Service: Urology;  Laterality: Right;  . left GSV laser ablation    . PILONIDAL CYST EXCISION  1998  . ROBOTIC ASSITED PARTIAL NEPHRECTOMY Right 09/07/2015   Procedure: RIGHT ROBOTIC ASSITED PARTIAL NEPHRECTOMY;  Surgeon: Ardis Hughs, MD;  Location: [facility];  Service: Urology;  Laterality: Right;  . TONSILLECTOMY AND ADENOIDECTOMY  1964  . TOTAL KNEE ARTHROPLASTY Right 10/22/2017   Procedure: RIGHT TOTAL KNEE ARTHROPLASTY;  Surgeon: Susa Day, MD;  Location: [facility];  Service: Orthopedics;  Laterality: Right;  Adductor Block  Family History  Problem Relation Age of Onset  . Cancer Father        sinus tumor / then prostate concer / bone cancer  . Hyperlipidemia Brother    Social History:  reports that he quit smoking about 13 years ago. he has never used smokeless tobacco. He reports that he drinks alcohol. He reports that he does not use drugs.  Allergies: No Known Allergies   (Not in a hospital admission)  No results found for this or any previous visit (from the past 48 hour(s)). No results  found.  Review of Systems  Constitutional: Negative.   HENT: Negative.   Eyes: Negative.   Respiratory: Negative.   Cardiovascular: Negative.   Gastrointestinal: Negative.   Genitourinary: Negative.   Musculoskeletal: Positive for joint pain.  Skin: Negative.   Neurological: Negative.   Psychiatric/Behavioral: Negative.     There were no vitals taken for this visit. Physical Exam  Constitutional: He is oriented to person, place, and time. He appears well-developed.  HENT:  Head: Normocephalic.  Eyes: Pupils are equal, round, and reactive to light.  Neck: Normal range of motion.  Cardiovascular: Normal rate.  Respiratory: Effort normal.  GI: Soft.  Musculoskeletal:  Patient is a 62 year old male.  Patient is awake, alert, oriented 3. Overweight. Slightly antalgic gait with the use of a cane.  On examination of the Left knee, tender on palpation of the medial Joint line. Nontender lateral joint line, patellar tendon, quadriceps tendon, patella, peroneal nerve and popliteal space. No calf pain or sign of DVT. No pain or laxity with varus or valgus stress. No instability noted. Painful McMurray's. Trace effusion noted. Range of motion 0-110. Positive patellofemoral crepitus. No patellofemoral pain on compression. Sensation intact distally. No erythema, ecchymosis or any rashes about the knee.  He is also tender to palpation over the right hip greater trochanter.  Prior x-rays reviewed with end-stage degenerative changes left knee medial joint space.  Neurological: He is alert and oriented to person, place, and time.     Assessment/Plan L knee primary osteoarthritis  Pt with end-stage Left knee DJD, bone-on-bone, refractory to conservative tx, scheduled for Left total knee replacement by Dr. Tonita Cong on February 28. We again discussed the procedure itself as well as risks, complications and alternatives, including but not limited to DVT, PE, infx, bleeding, failure of procedure,  need for secondary procedure including manipulation, nerve injury, ongoing pain/symptoms, anesthesia risk, even stroke or death. Also discussed typical post-op protocols, activity restrictions, need for PT, flexion/extension exercises, time out of work. Discussed need for DVT ppx post-op per protocol. Discussed dental ppx and infx prevention. Also discussed limitations post-operatively such as kneeling and squatting. All questions were answered. Patient desires to proceed with surgery as scheduled.  Will hold supplements, ASA and NSAIDs accordingly. Will remain NPO after midnight the night before surgery. Will present to [facility] for pre-op testing. Anticipate hospital stay to include at least 2 midnights given medical history and to ensure proper pain control. Plan ASA 325mg  BID for DVT ppx post-op. Plan Oxycodone, Robaxin, Colace, Miralax. We will utilize Movantik in the hospital to also decrease risk of constipation like he dealt with last time. Plan To discharge to outpatient physical therapy at [facility] outpatient like last time which he did well with. Will follow up 10-14 days post-op for Staple removal and xrays.  Plan left total knee replacement  Cecilie Kicks., PA-C for Dr. Tonita Cong 12/25/2017, 1:05 PM

## 2017-12-28 ENCOUNTER — Encounter (HOSPITAL_COMMUNITY)
Admission: RE | Admit: 2017-12-28 | Discharge: 2017-12-28 | Disposition: A | Discharge status: Home / Self Care | Payer: Managed Care, Other (non HMO) | Source: Ambulatory Visit | Attending: Specialist | Admitting: Specialist

## 2017-12-28 ENCOUNTER — Encounter (HOSPITAL_COMMUNITY): Payer: Self-pay

## 2017-12-28 ENCOUNTER — Other Ambulatory Visit: Payer: Self-pay

## 2017-12-28 HISTORY — DX: Cerebral aneurysm, nonruptured: I67.1

## 2017-12-28 HISTORY — DX: Male erectile dysfunction, unspecified: N52.9

## 2017-12-28 HISTORY — DX: Testicular hypofunction: E29.1

## 2017-12-28 HISTORY — DX: Morbid (severe) obesity due to excess calories: E66.01

## 2017-12-28 HISTORY — DX: Chronic sinusitis, unspecified: J32.9

## 2017-12-28 HISTORY — DX: Respiratory failure, unspecified, unspecified whether with hypoxia or hypercapnia: J96.90

## 2017-12-28 LAB — GLUCOSE, CAPILLARY: GLUCOSE-CAPILLARY: 101 mg/dL — AB (ref 65–99)

## 2017-12-28 LAB — URINALYSIS, ROUTINE W REFLEX MICROSCOPIC
Bilirubin Urine: NEGATIVE
Glucose, UA: NEGATIVE mg/dL
Hgb urine dipstick: NEGATIVE
Ketones, ur: NEGATIVE mg/dL
LEUKOCYTES UA: NEGATIVE
NITRITE: NEGATIVE
PROTEIN: NEGATIVE mg/dL
SPECIFIC GRAVITY, URINE: 1.011 (ref 1.005–1.030)
pH: 6 (ref 5.0–8.0)

## 2017-12-28 LAB — CBC
HEMATOCRIT: 46.7 % (ref 39.0–52.0)
HEMOGLOBIN: 15.3 g/dL (ref 13.0–17.0)
MCH: 28.7 pg (ref 26.0–34.0)
MCHC: 32.8 g/dL (ref 30.0–36.0)
MCV: 87.6 fL (ref 78.0–100.0)
Platelets: 242 10*3/uL (ref 150–400)
RBC: 5.33 MIL/uL (ref 4.22–5.81)
RDW: 13.9 % (ref 11.5–15.5)
WBC: 8.8 10*3/uL (ref 4.0–10.5)

## 2017-12-28 LAB — SURGICAL PCR SCREEN
MRSA, PCR: NEGATIVE
STAPHYLOCOCCUS AUREUS: NEGATIVE

## 2017-12-28 LAB — BASIC METABOLIC PANEL
ANION GAP: 11 (ref 5–15)
BUN: 18 mg/dL (ref 6–20)
CHLORIDE: 105 mmol/L (ref 101–111)
CO2: 25 mmol/L (ref 22–32)
Calcium: 9.4 mg/dL (ref 8.9–10.3)
Creatinine, Ser: 1.16 mg/dL (ref 0.61–1.24)
GFR calc Af Amer: >60 mL/min (ref >60)
GFR calc non Af Amer: >60 mL/min (ref >60)
Glucose, Bld: 102 mg/dL — ABNORMAL HIGH (ref 65–99)
POTASSIUM: 4.4 mmol/L (ref 3.5–5.1)
SODIUM: 141 mmol/L (ref 135–145)

## 2017-12-28 LAB — APTT: aPTT: 29 seconds (ref 24–36)

## 2017-12-28 LAB — PROTIME-INR
INR: 1
Prothrombin Time: 13.1 seconds (ref 11.4–15.2)

## 2017-12-28 NOTE — Pre-Procedure Instructions (Signed)
BMP results 12/28/2017 faxed to Dr. Tonita Cong via epic.

## 2017-12-30 MED ORDER — TRANEXAMIC ACID 1000 MG/10ML IV SOLN
1000.0000 mg | INTRAVENOUS | Status: AC
  Administered 2017-12-31: 1000 mg via INTRAVENOUS
  Filled 2017-12-30: qty 1100

## 2017-12-30 MED ORDER — DEXTROSE 5 % IV SOLN
3.0000 g | INTRAVENOUS | Status: AC
  Administered 2017-12-31: 3 g via INTRAVENOUS
  Filled 2017-12-30: qty 3

## 2017-12-31 ENCOUNTER — Encounter (HOSPITAL_COMMUNITY)
Admission: RE | Disposition: A | Discharge status: Home / Self Care | Payer: Self-pay | Source: Ambulatory Visit | Attending: Specialist

## 2017-12-31 ENCOUNTER — Inpatient Hospital Stay (HOSPITAL_COMMUNITY): Payer: Managed Care, Other (non HMO)

## 2017-12-31 ENCOUNTER — Encounter (HOSPITAL_COMMUNITY): Payer: Self-pay | Admitting: Anesthesiology

## 2017-12-31 ENCOUNTER — Inpatient Hospital Stay (HOSPITAL_COMMUNITY): Payer: Managed Care, Other (non HMO) | Admitting: Anesthesiology

## 2017-12-31 ENCOUNTER — Other Ambulatory Visit: Payer: Self-pay

## 2017-12-31 ENCOUNTER — Inpatient Hospital Stay (HOSPITAL_COMMUNITY)
Admission: RE | Admit: 2017-12-31 | Discharge: 2018-01-02 | DRG: 470 | Disposition: A | Discharge status: Home / Self Care | Payer: Managed Care, Other (non HMO) | Source: Ambulatory Visit | Attending: Specialist | Admitting: Specialist

## 2017-12-31 DIAGNOSIS — Z96651 Presence of right artificial knee joint: Secondary | ICD-10-CM | POA: Diagnosis present

## 2017-12-31 DIAGNOSIS — G4730 Sleep apnea, unspecified: Secondary | ICD-10-CM | POA: Diagnosis present

## 2017-12-31 DIAGNOSIS — E1122 Type 2 diabetes mellitus with diabetic chronic kidney disease: Secondary | ICD-10-CM | POA: Diagnosis present

## 2017-12-31 DIAGNOSIS — Z96659 Presence of unspecified artificial knee joint: Secondary | ICD-10-CM

## 2017-12-31 DIAGNOSIS — Z85528 Personal history of other malignant neoplasm of kidney: Secondary | ICD-10-CM | POA: Diagnosis not present

## 2017-12-31 DIAGNOSIS — M25551 Pain in right hip: Secondary | ICD-10-CM | POA: Diagnosis present

## 2017-12-31 DIAGNOSIS — I129 Hypertensive chronic kidney disease with stage 1 through stage 4 chronic kidney disease, or unspecified chronic kidney disease: Secondary | ICD-10-CM | POA: Diagnosis present

## 2017-12-31 DIAGNOSIS — N183 Chronic kidney disease, stage 3 (moderate): Secondary | ICD-10-CM | POA: Diagnosis present

## 2017-12-31 DIAGNOSIS — Z87891 Personal history of nicotine dependence: Secondary | ICD-10-CM | POA: Diagnosis not present

## 2017-12-31 DIAGNOSIS — M1711 Unilateral primary osteoarthritis, right knee: Secondary | ICD-10-CM | POA: Diagnosis present

## 2017-12-31 DIAGNOSIS — M1712 Unilateral primary osteoarthritis, left knee: Principal | ICD-10-CM | POA: Diagnosis present

## 2017-12-31 DIAGNOSIS — Z86010 Personal history of colonic polyps: Secondary | ICD-10-CM

## 2017-12-31 DIAGNOSIS — M25562 Pain in left knee: Secondary | ICD-10-CM | POA: Diagnosis present

## 2017-12-31 DIAGNOSIS — Z6841 Body Mass Index (BMI) 40.0 and over, adult: Secondary | ICD-10-CM

## 2017-12-31 HISTORY — PX: TOTAL KNEE ARTHROPLASTY: SHX125

## 2017-12-31 LAB — GLUCOSE, CAPILLARY
GLUCOSE-CAPILLARY: 111 mg/dL — AB (ref 65–99)
Glucose-Capillary: 126 mg/dL — ABNORMAL HIGH (ref 65–99)
Glucose-Capillary: 122 mg/dL — ABNORMAL HIGH (ref 65–99)

## 2017-12-31 SURGERY — ARTHROPLASTY, KNEE, TOTAL
Anesthesia: Spinal | Site: Knee | Laterality: Left

## 2017-12-31 MED ORDER — HYDROMORPHONE HCL 1 MG/ML IJ SOLN
1.0000 mg | INTRAMUSCULAR | Status: DC | PRN
  Administered 2017-12-31 – 2018-01-02 (×2): 1 mg via INTRAVENOUS
  Filled 2017-12-31 (×2): qty 1

## 2017-12-31 MED ORDER — LIDOCAINE 2% (20 MG/ML) 5 ML SYRINGE
INTRAMUSCULAR | Status: AC
  Filled 2017-12-31: qty 5

## 2017-12-31 MED ORDER — OXYCODONE HCL 5 MG PO TABS: 5.0000 mg | ORAL_TABLET | Freq: Four times a day (QID) | ORAL | 0 refills | Status: AC | PRN

## 2017-12-31 MED ORDER — METHOCARBAMOL 500 MG PO TABS
500.0000 mg | ORAL_TABLET | Freq: Four times a day (QID) | ORAL | Status: DC | PRN
  Administered 2017-12-31 – 2018-01-02 (×6): 500 mg via ORAL
  Filled 2017-12-31 (×6): qty 1

## 2017-12-31 MED ORDER — AMLODIPINE BESYLATE 10 MG PO TABS
10.0000 mg | ORAL_TABLET | Freq: Every day | ORAL | Status: DC
  Administered 2018-01-01 – 2018-01-02 (×2): 10 mg via ORAL
  Filled 2017-12-31 (×2): qty 1

## 2017-12-31 MED ORDER — PROPOFOL 10 MG/ML IV BOLUS
INTRAVENOUS | Status: AC
  Filled 2017-12-31: qty 20

## 2017-12-31 MED ORDER — RISAQUAD PO CAPS
1.0000 | ORAL_CAPSULE | Freq: Every day | ORAL | Status: DC
  Administered 2017-12-31 – 2018-01-02 (×3): 1 via ORAL
  Filled 2017-12-31 (×3): qty 1

## 2017-12-31 MED ORDER — INSULIN ASPART 100 UNIT/ML ~~LOC~~ SOLN: 0.0000 [IU] | Freq: Three times a day (TID) | SUBCUTANEOUS | Status: DC

## 2017-12-31 MED ORDER — PROPOFOL 10 MG/ML IV BOLUS
INTRAVENOUS | Status: DC | PRN
  Administered 2017-12-31: 20 mg via INTRAVENOUS

## 2017-12-31 MED ORDER — ASPIRIN 325 MG PO TABS
325.0000 mg | ORAL_TABLET | Freq: Two times a day (BID) | ORAL | Status: DC
  Administered 2018-01-01 – 2018-01-02 (×3): 325 mg via ORAL
  Filled 2017-12-31 (×3): qty 1

## 2017-12-31 MED ORDER — BISACODYL 5 MG PO TBEC: 5.0000 mg | DELAYED_RELEASE_TABLET | Freq: Every day | ORAL | Status: DC | PRN

## 2017-12-31 MED ORDER — ONDANSETRON HCL 4 MG PO TABS: 4.0000 mg | ORAL_TABLET | Freq: Four times a day (QID) | ORAL | Status: DC | PRN

## 2017-12-31 MED ORDER — METHOCARBAMOL 1000 MG/10ML IJ SOLN
500.0000 mg | Freq: Four times a day (QID) | INTRAVENOUS | Status: DC | PRN
  Administered 2017-12-31: 500 mg via INTRAVENOUS
  Filled 2017-12-31: qty 550

## 2017-12-31 MED ORDER — MIDAZOLAM HCL 2 MG/2ML IJ SOLN
INTRAMUSCULAR | Status: AC
  Filled 2017-12-31: qty 2

## 2017-12-31 MED ORDER — VITAMIN B-12 100 MCG PO TABS
100.0000 ug | ORAL_TABLET | Freq: Every day | ORAL | Status: DC
  Administered 2017-12-31 – 2018-01-02 (×3): 100 ug via ORAL
  Filled 2017-12-31 (×3): qty 1

## 2017-12-31 MED ORDER — ALUM & MAG HYDROXIDE-SIMETH 200-200-20 MG/5ML PO SUSP: 30.0000 mL | Freq: Four times a day (QID) | ORAL | Status: DC | PRN

## 2017-12-31 MED ORDER — MIDAZOLAM HCL 2 MG/2ML IJ SOLN
INTRAMUSCULAR | Status: DC | PRN
Start: 2017-12-31 — End: 2017-12-31
  Administered 2017-12-31 (×4): 0.5 mg via INTRAVENOUS

## 2017-12-31 MED ORDER — FENTANYL CITRATE (PF) 100 MCG/2ML IJ SOLN
INTRAMUSCULAR | Status: AC
  Filled 2017-12-31: qty 2

## 2017-12-31 MED ORDER — KCL IN DEXTROSE-NACL 20-5-0.45 MEQ/L-%-% IV SOLN
INTRAVENOUS | Status: AC
  Administered 2017-12-31: 16:00:00 via INTRAVENOUS
  Filled 2017-12-31 (×2): qty 1000

## 2017-12-31 MED ORDER — FENOFIBRATE 160 MG PO TABS
160.0000 mg | ORAL_TABLET | Freq: Every day | ORAL | Status: DC
  Administered 2018-01-01 – 2018-01-02 (×2): 160 mg via ORAL
  Filled 2017-12-31 (×2): qty 1

## 2017-12-31 MED ORDER — VITAMIN C 500 MG PO TABS
500.0000 mg | ORAL_TABLET | Freq: Every day | ORAL | Status: DC
  Administered 2017-12-31 – 2018-01-02 (×3): 500 mg via ORAL
  Filled 2017-12-31 (×3): qty 1

## 2017-12-31 MED ORDER — OXYCODONE HCL 5 MG PO TABS
5.0000 mg | ORAL_TABLET | ORAL | Status: DC | PRN
  Administered 2017-12-31 – 2018-01-01 (×2): 5 mg via ORAL
  Filled 2017-12-31 (×2): qty 1

## 2017-12-31 MED ORDER — ACETAMINOPHEN 650 MG RE SUPP
650.0000 mg | RECTAL | Status: DC | PRN
Start: 2017-12-31 — End: 2018-01-02

## 2017-12-31 MED ORDER — MEPERIDINE HCL 50 MG/ML IJ SOLN: 6.2500 mg | INTRAMUSCULAR | Status: DC | PRN

## 2017-12-31 MED ORDER — PROPOFOL 500 MG/50ML IV EMUL
INTRAVENOUS | Status: DC | PRN
  Administered 2017-12-31: 50 ug/kg/min via INTRAVENOUS

## 2017-12-31 MED ORDER — PROMETHAZINE HCL 25 MG/ML IJ SOLN: 6.2500 mg | INTRAMUSCULAR | Status: DC | PRN

## 2017-12-31 MED ORDER — LACTATED RINGERS IV SOLN
INTRAVENOUS | Status: DC
  Administered 2017-12-31: 11:00:00 via INTRAVENOUS
  Administered 2017-12-31: 1000 mL via INTRAVENOUS

## 2017-12-31 MED ORDER — ACETAMINOPHEN 10 MG/ML IV SOLN
1000.0000 mg | INTRAVENOUS | Status: AC
  Administered 2017-12-31: 1000 mg via INTRAVENOUS
  Filled 2017-12-31: qty 100

## 2017-12-31 MED ORDER — POLYETHYLENE GLYCOL 3350 17 G PO PACK: 17.0000 g | PACK | Freq: Every day | ORAL | 0 refills | Status: DC

## 2017-12-31 MED ORDER — DOCUSATE SODIUM 100 MG PO CAPS: 100.0000 mg | ORAL_CAPSULE | Freq: Two times a day (BID) | ORAL | 1 refills | Status: AC | PRN

## 2017-12-31 MED ORDER — LACTATED RINGERS IV SOLN: INTRAVENOUS | Status: DC

## 2017-12-31 MED ORDER — OXYCODONE HCL 5 MG PO TABS
10.0000 mg | ORAL_TABLET | ORAL | Status: DC | PRN
  Administered 2017-12-31: 5 mg via ORAL
  Administered 2017-12-31 – 2018-01-02 (×8): 10 mg via ORAL
  Filled 2017-12-31 (×10): qty 2

## 2017-12-31 MED ORDER — PHENOL 1.4 % MT LIQD
1.0000 | OROMUCOSAL | Status: DC | PRN
  Filled 2017-12-31: qty 177

## 2017-12-31 MED ORDER — MENTHOL 3 MG MT LOZG: 1.0000 | LOZENGE | OROMUCOSAL | Status: DC | PRN

## 2017-12-31 MED ORDER — BUPIVACAINE IN DEXTROSE 0.75-8.25 % IT SOLN
INTRATHECAL | Status: DC | PRN
  Administered 2017-12-31: 2 mL via INTRATHECAL

## 2017-12-31 MED ORDER — DEXAMETHASONE SODIUM PHOSPHATE 10 MG/ML IJ SOLN
INTRAMUSCULAR | Status: AC
  Filled 2017-12-31: qty 1

## 2017-12-31 MED ORDER — BUPIVACAINE-EPINEPHRINE 0.25% -1:200000 IJ SOLN
INTRAMUSCULAR | Status: AC
  Filled 2017-12-31: qty 1

## 2017-12-31 MED ORDER — FENTANYL CITRATE (PF) 100 MCG/2ML IJ SOLN
50.0000 ug | INTRAMUSCULAR | Status: DC | PRN
  Administered 2017-12-31: 50 ug via INTRAVENOUS

## 2017-12-31 MED ORDER — DEXTROSE 5 % IV SOLN
3.0000 g | Freq: Three times a day (TID) | INTRAVENOUS | Status: AC
  Administered 2017-12-31 – 2018-01-01 (×3): 3 g via INTRAVENOUS
  Filled 2017-12-31 (×3): qty 3

## 2017-12-31 MED ORDER — ONDANSETRON HCL 4 MG/2ML IJ SOLN: 4.0000 mg | Freq: Four times a day (QID) | INTRAMUSCULAR | Status: DC | PRN

## 2017-12-31 MED ORDER — SODIUM CHLORIDE 0.9 % IV SOLN
INTRAVENOUS | Status: AC
  Filled 2017-12-31: qty 500000

## 2017-12-31 MED ORDER — SODIUM CHLORIDE 0.9 % IR SOLN
Status: DC | PRN
  Administered 2017-12-31: 3000 mL

## 2017-12-31 MED ORDER — STERILE WATER FOR IRRIGATION IR SOLN
Status: DC | PRN
  Administered 2017-12-31: 2000 mL

## 2017-12-31 MED ORDER — METOPROLOL SUCCINATE ER 50 MG PO TB24
50.0000 mg | ORAL_TABLET | Freq: Every morning | ORAL | Status: DC
  Administered 2018-01-01 – 2018-01-02 (×2): 50 mg via ORAL
  Filled 2017-12-31 (×2): qty 1

## 2017-12-31 MED ORDER — DEXAMETHASONE SODIUM PHOSPHATE 10 MG/ML IJ SOLN
INTRAMUSCULAR | Status: DC | PRN
  Administered 2017-12-31: 10 mg via INTRAVENOUS

## 2017-12-31 MED ORDER — DOCUSATE SODIUM 100 MG PO CAPS
100.0000 mg | ORAL_CAPSULE | Freq: Two times a day (BID) | ORAL | Status: DC
  Administered 2017-12-31 – 2018-01-02 (×4): 100 mg via ORAL
  Filled 2017-12-31 (×4): qty 1

## 2017-12-31 MED ORDER — POLYETHYLENE GLYCOL 3350 17 G PO PACK
17.0000 g | PACK | Freq: Every day | ORAL | Status: DC | PRN
  Administered 2017-12-31 – 2018-01-01 (×2): 17 g via ORAL
  Filled 2017-12-31 (×2): qty 1

## 2017-12-31 MED ORDER — MIDAZOLAM HCL 2 MG/2ML IJ SOLN
1.0000 mg | INTRAMUSCULAR | Status: DC | PRN
  Administered 2017-12-31: 2 mg via INTRAVENOUS

## 2017-12-31 MED ORDER — LEVETIRACETAM ER 500 MG PO TB24
3000.0000 mg | ORAL_TABLET | Freq: Every day | ORAL | Status: DC
  Administered 2018-01-01 – 2018-01-02 (×2): 3000 mg via ORAL
  Filled 2017-12-31 (×2): qty 6

## 2017-12-31 MED ORDER — ACETAMINOPHEN 10 MG/ML IV SOLN
1000.0000 mg | Freq: Four times a day (QID) | INTRAVENOUS | Status: AC
  Administered 2017-12-31 – 2018-01-01 (×4): 1000 mg via INTRAVENOUS
  Filled 2017-12-31 (×4): qty 100

## 2017-12-31 MED ORDER — MAGNESIUM CITRATE PO SOLN: 1.0000 | Freq: Once | ORAL | Status: DC | PRN

## 2017-12-31 MED ORDER — ACETAMINOPHEN 325 MG PO TABS: 650.0000 mg | ORAL_TABLET | ORAL | Status: DC | PRN

## 2017-12-31 MED ORDER — POLYMYXIN B SULFATE 500000 UNITS IJ SOLR
INTRAMUSCULAR | Status: DC | PRN
  Administered 2017-12-31: 500 mL

## 2017-12-31 MED ORDER — ASPIRIN EC 325 MG PO TBEC: 325.0000 mg | DELAYED_RELEASE_TABLET | Freq: Two times a day (BID) | ORAL | 1 refills | Status: DC

## 2017-12-31 MED ORDER — HYDROMORPHONE HCL 1 MG/ML IJ SOLN: 0.2500 mg | INTRAMUSCULAR | Status: DC | PRN

## 2017-12-31 MED ORDER — ONDANSETRON HCL 4 MG/2ML IJ SOLN
INTRAMUSCULAR | Status: DC | PRN
  Administered 2017-12-31: 4 mg via INTRAVENOUS

## 2017-12-31 MED ORDER — METHOCARBAMOL 500 MG PO TABS: 500.0000 mg | ORAL_TABLET | Freq: Four times a day (QID) | ORAL | 1 refills | Status: DC | PRN

## 2017-12-31 MED ORDER — GABAPENTIN 300 MG PO CAPS
300.0000 mg | ORAL_CAPSULE | Freq: Three times a day (TID) | ORAL | Status: DC
  Administered 2017-12-31 – 2018-01-02 (×6): 300 mg via ORAL
  Filled 2017-12-31 (×6): qty 1

## 2017-12-31 MED ORDER — PROPOFOL 10 MG/ML IV BOLUS
INTRAVENOUS | Status: AC
  Filled 2017-12-31: qty 40

## 2017-12-31 MED ORDER — BUPIVACAINE-EPINEPHRINE 0.25% -1:200000 IJ SOLN
INTRAMUSCULAR | Status: DC | PRN
  Administered 2017-12-31: 30 mL

## 2017-12-31 MED ORDER — ROPIVACAINE HCL 7.5 MG/ML IJ SOLN
INTRAMUSCULAR | Status: DC | PRN
  Administered 2017-12-31: 20 mL via PERINEURAL

## 2017-12-31 MED ORDER — ONDANSETRON HCL 4 MG/2ML IJ SOLN
INTRAMUSCULAR | Status: AC
  Filled 2017-12-31: qty 2

## 2017-12-31 MED ORDER — TESTOSTERONE 20.25 MG/ACT (1.62%) TD GEL
4.0000 | Freq: Every day | TRANSDERMAL | Status: DC
  Administered 2018-01-02: 4 via TOPICAL

## 2017-12-31 SURGICAL SUPPLY — 63 items
BAG DECANTER FOR FLEXI CONT (MISCELLANEOUS) ×3 IMPLANT
BAG ZIPLOCK 12X15 (MISCELLANEOUS) ×3 IMPLANT
BANDAGE ACE 4X5 VEL STRL LF (GAUZE/BANDAGES/DRESSINGS) ×3 IMPLANT
BANDAGE ACE 6X5 VEL STRL LF (GAUZE/BANDAGES/DRESSINGS) ×3 IMPLANT
BLADE SAG 18X100X1.27 (BLADE) ×3 IMPLANT
BLADE SAW SGTL 11.0X1.19X90.0M (BLADE) IMPLANT
BLADE SAW SGTL 13.0X1.19X90.0M (BLADE) ×3 IMPLANT
CAPT KNEE TOTAL 3 ATTUNE ×3 IMPLANT
CEMENT HV SMART SET (Cement) ×6 IMPLANT
CLOSURE WOUND 1/2 X4 (GAUZE/BANDAGES/DRESSINGS)
CLOTH 2% CHLOROHEXIDINE 3PK (PERSONAL CARE ITEMS) ×3 IMPLANT
COVER SURGICAL LIGHT HANDLE (MISCELLANEOUS) ×3 IMPLANT
CUFF TOURN SGL QUICK 34 (TOURNIQUET CUFF) ×2
CUFF TRNQT CYL 34X4X40X1 (TOURNIQUET CUFF) ×1 IMPLANT
DECANTER SPIKE VIAL GLASS SM (MISCELLANEOUS) ×3 IMPLANT
DRAPE INCISE IOBAN 66X45 STRL (DRAPES) ×3 IMPLANT
DRAPE ORTHO SPLIT 77X108 STRL (DRAPES) ×4
DRAPE SHEET LG 3/4 BI-LAMINATE (DRAPES) ×3 IMPLANT
DRAPE SURG ORHT 6 SPLT 77X108 (DRAPES) ×2 IMPLANT
DRAPE U-SHAPE 47X51 STRL (DRAPES) ×3 IMPLANT
DRSG AQUACEL AG ADV 3.5X10 (GAUZE/BANDAGES/DRESSINGS) ×3 IMPLANT
DRSG AQUACEL AG ADV 3.5X14 (GAUZE/BANDAGES/DRESSINGS) ×3 IMPLANT
DRSG TEGADERM 4X4.75 (GAUZE/BANDAGES/DRESSINGS) IMPLANT
DURAPREP 26ML APPLICATOR (WOUND CARE) ×3 IMPLANT
ELECT REM PT RETURN 15FT ADLT (MISCELLANEOUS) ×3 IMPLANT
EVACUATOR 1/8 PVC DRAIN (DRAIN) IMPLANT
GAUZE SPONGE 2X2 8PLY STRL LF (GAUZE/BANDAGES/DRESSINGS) IMPLANT
GLOVE BIOGEL PI IND STRL 7.0 (GLOVE) ×1 IMPLANT
GLOVE BIOGEL PI IND STRL 8 (GLOVE) ×1 IMPLANT
GLOVE BIOGEL PI INDICATOR 7.0 (GLOVE) ×2
GLOVE BIOGEL PI INDICATOR 8 (GLOVE) ×2
GLOVE SURG SS PI 7.0 STRL IVOR (GLOVE) IMPLANT
GLOVE SURG SS PI 7.5 STRL IVOR (GLOVE) ×3 IMPLANT
GLOVE SURG SS PI 8.0 STRL IVOR (GLOVE) ×3 IMPLANT
GOWN STRL REUS W/TWL XL LVL3 (GOWN DISPOSABLE) ×6 IMPLANT
HANDPIECE INTERPULSE COAX TIP (DISPOSABLE) ×2
HEMOSTAT SPONGE AVITENE ULTRA (HEMOSTASIS) IMPLANT
IMMOBILIZER KNEE 20 (SOFTGOODS) ×3
IMMOBILIZER KNEE 20 THIGH 36 (SOFTGOODS) ×1 IMPLANT
MANIFOLD NEPTUNE II (INSTRUMENTS) ×3 IMPLANT
NS IRRIG 1000ML POUR BTL (IV SOLUTION) IMPLANT
PACK TOTAL KNEE CUSTOM (KITS) ×3 IMPLANT
POSITIONER SURGICAL ARM (MISCELLANEOUS) ×3 IMPLANT
SET HNDPC FAN SPRY TIP SCT (DISPOSABLE) ×1 IMPLANT
SPONGE GAUZE 2X2 STER 10/PKG (GAUZE/BANDAGES/DRESSINGS)
SPONGE LAP 18X18 X RAY DECT (DISPOSABLE) ×3 IMPLANT
SPONGE SURGIFOAM ABS GEL 100 (HEMOSTASIS) IMPLANT
STAPLER VISISTAT (STAPLE) ×3 IMPLANT
STRIP CLOSURE SKIN 1/2X4 (GAUZE/BANDAGES/DRESSINGS) IMPLANT
SUT BONE WAX W31G (SUTURE) ×3 IMPLANT
SUT MNCRL AB 4-0 PS2 18 (SUTURE) ×3 IMPLANT
SUT STRATAFIX 0 PDS 27 VIOLET (SUTURE) ×3
SUT VIC AB 1 CT1 27 (SUTURE) ×6
SUT VIC AB 1 CT1 27XBRD ANTBC (SUTURE) ×3 IMPLANT
SUT VIC AB 2-0 CT1 27 (SUTURE) ×6
SUT VIC AB 2-0 CT1 TAPERPNT 27 (SUTURE) ×3 IMPLANT
SUTURE STRATFX 0 PDS 27 VIOLET (SUTURE) ×1 IMPLANT
SYR 50ML LL SCALE MARK (SYRINGE) IMPLANT
TOWER CARTRIDGE SMART MIX (DISPOSABLE) ×3 IMPLANT
TRAY FOLEY W/METER SILVER 16FR (SET/KITS/TRAYS/PACK) ×3 IMPLANT
WATER STERILE IRR 1000ML POUR (IV SOLUTION) ×6 IMPLANT
WRAP KNEE MAXI GEL POST OP (GAUZE/BANDAGES/DRESSINGS) ×3 IMPLANT
YANKAUER SUCT BULB TIP 10FT TU (MISCELLANEOUS) ×3 IMPLANT

## 2017-12-31 NOTE — Anesthesia Procedure Notes (Signed)
Spinal  Patient location during procedure: OR Start time: 12/31/2017 10:21 AM End time: 12/31/2017 10:23 AM Staffing Anesthesiologist: Effie Berkshire, MD Performed: anesthesiologist  Preanesthetic Checklist Completed: patient identified, site marked, surgical consent, pre-op evaluation, timeout performed, IV checked, risks and benefits discussed and monitors and equipment checked Spinal Block Patient position: sitting Prep: DuraPrep Patient monitoring: heart rate, continuous pulse ox, blood pressure and cardiac monitor Approach: midline Location: L4-5 Injection technique: single-shot Needle Needle type: Introducer and Pencan  Needle gauge: 24 G Needle length: 9 cm Additional Notes Negative paresthesia. Negative blood return. Positive free-flowing CSF. Expiration date of kit checked and confirmed. Patient tolerated procedure well, without complications.

## 2017-12-31 NOTE — Anesthesia Procedure Notes (Signed)
Procedure Name: MAC Date/Time: 12/31/2017 10:25 AM Performed by: Dione Booze, CRNA Pre-anesthesia Checklist: Patient identified, Emergency Drugs available, Suction available and Patient being monitored Patient Re-evaluated:Patient Re-evaluated prior to induction Oxygen Delivery Method: Simple face mask Placement Confirmation: positive ETCO2

## 2017-12-31 NOTE — Progress Notes (Signed)
Nurse paged Lawrence [name] to make her aware of patient wanting regular diet instead of carb mod and patient needs weight bearing status. Per Lawrence [name], pt can have regular diet and is weight bearing as tolerated.

## 2017-12-31 NOTE — Progress Notes (Signed)
Pt refused this evenings blood sugar check. Per order, pt is ACHS. Pt states that he only checks his blood sugar once daily at home and wants to continue that during hospital stay. Pt states that we can check in AM, but refuses all other ordered occurrences.

## 2017-12-31 NOTE — Anesthesia Preprocedure Evaluation (Addendum)
Anesthesia Evaluation  Patient identified by MRN, date of birth, ID band Patient awake    Reviewed: Allergy & Precautions, NPO status , Patient's Chart, lab work & pertinent test results  Airway Mallampati: II  TM Distance: >3 FB Neck ROM: Full    Dental  (+) Teeth Intact, Dental Advisory Given   Pulmonary sleep apnea , former smoker,    breath sounds clear to auscultation       Cardiovascular hypertension, Pt. on medications and Pt. on home beta blockers + Peripheral Vascular Disease   Rhythm:Regular Rate:Normal     Neuro/Psych Seizures -,   Neuromuscular disease    GI/Hepatic   Endo/Other  diabetes, Type 2, Oral Hypoglycemic Agents  Renal/GU Renal disease     Musculoskeletal negative musculoskeletal ROS (+) Arthritis ,   Abdominal (+) + obese,   Peds  Hematology   Anesthesia Other Findings   Reproductive/Obstetrics                            Lab Results  Component Value Date   WBC 8.8 12/28/2017   HGB 15.3 12/28/2017   HCT 46.7 12/28/2017   MCV 87.6 12/28/2017   PLT 242 12/28/2017   Lab Results  Component Value Date   INR 1.00 12/28/2017   INR 1.06 10/19/2017   INR 1.08 10/15/2015   EKG: sinus bradycardia per interpretation from [facility]. Pt declines repeat EKG   Anesthesia Physical Anesthesia Plan  ASA: III  Anesthesia Plan: Spinal   Post-op Pain Management:  Regional for Post-op pain   Induction: Intravenous  PONV Risk Score and Plan: Ondansetron, Dexamethasone, Propofol infusion and Midazolam  Airway Management Planned: Simple Face Mask  Additional Equipment: None  Intra-op Plan:   Post-operative Plan:   Informed Consent: I have reviewed the patients History and Physical, chart, labs and discussed the procedure including the risks, benefits and alternatives for the proposed anesthesia with the patient or authorized representative who has indicated his/her  understanding and acceptance.   Dental advisory given  Plan Discussed with: CRNA  Anesthesia Plan Comments:        Anesthesia Quick Evaluation

## 2017-12-31 NOTE — Discharge Summary (Signed)
Physician Discharge Summary   Patient ID: Lawrence [name] MRN: [id number] DOB/AGE: Sep 16, 1956 62 y.o.  Admit date: 12/31/2017 Discharge date: 01/02/2018  Primary Diagnosis: left knee primary osteoarthritis  Admission Diagnoses:  Past Medical History:  Diagnosis Date  . Acute renal insufficiency 2016   history of  . Anemia    iron deficiency  . Arthritis    osteoarthritis both knees  . Bilateral cataracts   . Cancer ([place])    kidney  . Cellulitis    leg  . 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 Chronic kidney disease 2016   bil renal mass, CKD stage 3  . Colon polyp   . Diabetes mellitus without complication ([place])    type II  . Dyslipidemia   . Erectile dysfunction   . Hypertension   . Hypogonadism in male   . Morbid obesity ([place])   . Non-traumatic rhabdomyolysis    during surgery  . Renal cyst 2016   bilateral  . Respiratory failure ([place]) 06/20/2017  . 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 Sinusitis   . Sleep apnea    uses c-pap machine  . Varicose veins of legs   . Vitamin B 12 deficiency    Discharge Diagnoses:   Principal Problem:   Primary osteoarthritis of left knee Active Problems:   Right knee DJD  Estimated body mass index is 44.89 kg/m as calculated from the following:   Height as of this encounter: 6' (1.829 m).   Weight as of this encounter: 150.1 kg (331 lb).  Procedure:  Procedure(s) (LRB): LEFT TOTAL KNEE ARTHROPLASTY (Left)   Consults: None  HPI: see H&P Laboratory Data: Admission on 12/31/2017  Component Date Value Ref Range Status  . Glucose-Capillary 12/31/2017 111* 65 - 99 mg/dL Final  . Glucose-Capillary 12/31/2017 126* 65 - 99 mg/dL Final  . Glucose-Capillary 12/31/2017 122* 65 - 99 mg/dL Final  Hospital Outpatient Visit on 12/28/2017  Component Date Value Ref Range Status  . aPTT 12/28/2017 29  24 - 36 seconds Final   Performed at [facility], [place] [address], [place], [place] [address]  . Sodium 12/28/2017 141  135 - 145 mmol/L Final  . Potassium 12/28/2017 4.4  3.5 - 5.1 mmol/L Final  . Chloride 12/28/2017 105  101 - 111 mmol/L Final  . CO2 12/28/2017 25  22 - 32 mmol/L Final  . Glucose, Bld 12/28/2017 102* 65 - 99 mg/dL Final  . BUN 12/28/2017 18  6 - 20 mg/dL Final  . Creatinine, Ser 12/28/2017 1.16  0.61 - 1.24 mg/dL Final  . Calcium 12/28/2017 9.4  8.9 - 10.3 mg/dL Final  . GFR calc non Af Amer 12/28/2017 >60  >60 mL/min Final  . GFR calc Af Amer 12/28/2017 >60  >60 mL/min Final   Comment: (NOTE) The eGFR has been calculated using the CKD EPI equation. This calculation has not been validated in all clinical situations. eGFR's persistently <60 mL/min signify possible Chronic Kidney Disease.   Georgiann Hahn gap 12/28/2017 11  5 - 15 Final   Performed at [facility], [place] [address] [place], [place] [address]  . WBC 12/28/2017 8.8  4.0 - 10.5 K/uL Final  . RBC 12/28/2017 5.33  4.22 - 5.81 MIL/uL Final  . Hemoglobin 12/28/2017 15.3  13.0 - 17.0 g/dL Final  . HCT 12/28/2017 46.7  39.0 - 52.0 % Final  . MCV 12/28/2017 87.6  78.0 -  100.0 fL Final  . MCH 12/28/2017 28.7  26.0 - 34.0 pg Final  . MCHC 12/28/2017 32.8  30.0 - 36.0 g/dL Final  . RDW 12/28/2017 13.9  11.5 - 15.5 % Final  . Platelets 12/28/2017 242  150 - 400 K/uL Final   Performed at [facility], [place] [address] [place], [place] [address]  . Prothrombin Time 12/28/2017 13.1  11.4 - 15.2 seconds Final  . INR 12/28/2017 1.00   Final   Performed at [facility], [place] [address], [place], [place] [address]  . Color, Urine 12/28/2017 YELLOW  YELLOW Final  . APPearance 12/28/2017 CLEAR  CLEAR Final  . Specific Gravity, Urine 12/28/2017 1.011  1.005 - 1.030 Final  . pH 12/28/2017 6.0  5.0 - 8.0 Final  . Glucose, UA 12/28/2017 NEGATIVE  NEGATIVE mg/dL Final  . Hgb urine dipstick 12/28/2017  NEGATIVE  NEGATIVE Final  . Bilirubin Urine 12/28/2017 NEGATIVE  NEGATIVE Final  . Ketones, ur 12/28/2017 NEGATIVE  NEGATIVE mg/dL Final  . Protein, ur 12/28/2017 NEGATIVE  NEGATIVE mg/dL Final  . Nitrite 12/28/2017 NEGATIVE  NEGATIVE Final  . Leukocytes, UA 12/28/2017 NEGATIVE  NEGATIVE Final   Performed at [place] [address] [place], [place] [address]  . MRSA, PCR 12/28/2017 NEGATIVE  NEGATIVE Final  . Staphylococcus aureus 12/28/2017 NEGATIVE  NEGATIVE Final   Comment: (NOTE) The Xpert SA Assay (FDA approved for NASAL specimens in patients 55 years of age and older), is one component of a comprehensive surveillance program. It is not intended to diagnose infection nor to guide or monitor treatment. Performed at [facility], [place] [address] [place], [place] [address]   . Glucose-Capillary 12/28/2017 101* 65 - 99 mg/dL Final     X-Rays:Dg Knee Left Port  Result Date: 12/31/2017 CLINICAL DATA:  Left total knee replacement. EXAM: PORTABLE LEFT KNEE - 1-2 VIEW COMPARISON:  None. FINDINGS: The left knee demonstrates a total knee arthroplasty without evidence of hardware failure or complication. There is expected intra-articular air. There is no fracture or dislocation. The alignment is anatomic. Post-surgical changes noted in the surrounding soft tissues. IMPRESSION: Interval left total knee arthroplasty without evidence of acute postoperative complication. Electronically Signed   By: Titus Dubin M.D.   On: 12/31/2017 13:45    EKG: Orders placed or performed during the hospital encounter of 08/31/15  . EKG 12-Lead  . EKG 12-Lead     Hospital Course: Lawrence [name] is a 62 y.o. who was admitted to [facility]. They were brought to the operating room on 12/31/2017 and underwent Procedure(s): LEFT TOTAL KNEE ARTHROPLASTY.  Patient tolerated the procedure well and was later transferred to the recovery room and then to the  orthopaedic floor for postoperative care.  They were given PO and IV analgesics for pain control following their surgery.  They were given 24 hours of postoperative antibiotics of  Anti-infectives (From admission, onward)   Start     Dose/Rate Route Frequency Ordered Stop   12/31/17 1800  ceFAZolin (ANCEF) 3 g in dextrose 5 % 50 mL IVPB     3 g 130 mL/hr over 30 Minutes Intravenous Every 8 hours 12/31/17 1506 01/01/18 1759   12/31/17 1107  polymyxin B 500,000 Units, bacitracin 50,000 Units in sodium chloride 0.9 % 500 mL irrigation  Status:  Discontinued       As needed 12/31/17 1107 12/31/17 1252   12/31/17 0600  ceFAZolin (ANCEF) 3 g in  dextrose 5 % 50 mL IVPB     3 g 130 mL/hr over 30 Minutes Intravenous On call to O.R. 12/30/17 1244 12/31/17 1037     and started on DVT prophylaxis in the form of Aspirin, TED hose and SCDs.   PT and OT were ordered for total joint protocol.  Discharge planning consulted to help with postop disposition and equipment needs.  Patient had a fair night on the evening of surgery.  They started to get up OOB with therapy on day one.  Continued to work with therapy into day two. By day two, the patient had progressed with therapy and meeting their goals.  Incision was healing well.  Patient was seen in rounds and was ready to go home.   Diet: Diabetic diet Activity:WBAT Follow-up:in 10-14 days Disposition - Home Discharged Condition: good    Allergies as of 12/31/2017   No Known Allergies     Medication List    TAKE these medications   amLODipine 10 MG tablet Commonly known as:  NORVASC Take 10 mg by mouth daily.   ANDROGEL PUMP 20.25 MG/ACT (1.62%) Gel Generic drug:  Testosterone Apply 4 Pump topically daily.   aspirin EC 325 MG tablet Take 1 tablet (325 mg total) by mouth 2 (two) times daily.   docusate sodium 100 MG capsule Commonly known as:  COLACE Take 1 capsule (100 mg total) by mouth 2 (two) times daily as needed.   fenofibrate 160 MG  tablet Take 160 mg by mouth daily.   ferrous sulfate 325 (65 FE) MG EC tablet Take 325 mg by mouth daily with breakfast.   KEPPRA XR 750 MG Tb24 Generic drug:  Levetiracetam Take 3,000 mg by mouth daily.   methocarbamol 500 MG tablet Commonly known as:  ROBAXIN Take 1 tablet (500 mg total) by mouth every 6 (six) hours as needed for muscle spasms.   metoprolol succinate 50 MG 24 hr tablet Commonly known as:  TOPROL-XL Take 50 mg by mouth every morning. Take with or immediately following a meal   omega-3 acid ethyl esters 1 g capsule Commonly known as:  LOVAZA Take 1 g by mouth daily. Reported on 11/27/2015   ONE TOUCH ULTRA 2 w/Device Kit   OSTEO BI-FLEX TRIPLE STRENGTH Tabs Take 2 tablets by mouth daily.   oxyCODONE 5 MG immediate release tablet Commonly known as:  ROXICODONE Take 1-2 tablets (5-10 mg total) by mouth every 6 (six) hours as needed for moderate pain or severe pain.   polyethylene glycol packet Commonly known as:  MIRALAX Take 17 g by mouth daily.   sitaGLIPtin 100 MG tablet Commonly known as:  JANUVIA Take 100 mg by mouth daily.   vitamin B-12 100 MCG tablet Commonly known as:  CYANOCOBALAMIN Take 100 mcg by mouth daily.   vitamin C 500 MG tablet Commonly known as:  ASCORBIC ACID Take 500 mg by mouth daily.      Follow-up Information    Susa Day, MD Follow up in 2 week(s).   Specialty:  Orthopedic Surgery Contact information: [address] [place] [address] [phone]           Signed: Lacie Draft, PA-C Orthopaedic Surgery 12/31/2017, 5:19 PM

## 2017-12-31 NOTE — Interval H&P Note (Signed)
History and Physical Interval Note:  12/31/2017 7:07 AM  Lawrence [name]  has presented today for surgery, with the diagnosis of Left knee degenerative joint disease  The various methods of treatment have been discussed with the patient and family. After consideration of risks, benefits and other options for treatment, the patient has consented to  Procedure(s) with comments: LEFT TOTAL KNEE ARTHROPLASTY (Left) - 2.5 hrs as a surgical intervention .  The patient's history has been reviewed, patient examined, no change in status, stable for surgery.  I have reviewed the patient's chart and labs.  Questions were answered to the patient's satisfaction.     Tryphena Perkovich C

## 2017-12-31 NOTE — Evaluation (Signed)
Physical Therapy Evaluation Patient Details Name: Lawrence [name] MRN: [id number] DOB: May 08, 1956 Today's Date: 12/31/2017   History of Present Illness  Pt s/p L TKR and with hx of R TKR, DM and CKD  Clinical Impression  Pt s/p L TKR and presents with decreased L LE strength/ROM and post op pain limiting functional mobility.  Pt should progress to dc home with family assist and plans follow up OP PT.    Follow Up Recommendations Follow surgeon's recommendation for DC plan and follow-up therapies    Equipment Recommendations  None recommended by PT    Recommendations for Other Services       Precautions / Restrictions Precautions Precautions: Fall;Knee Required Braces or Orthoses: Knee Immobilizer - Left Knee Immobilizer - Left: Discontinue once straight leg raise with < 10 degree lag Restrictions Weight Bearing Restrictions: No Other Position/Activity Restrictions: WBAT      Mobility  Bed Mobility Overal bed mobility: Needs Assistance Bed Mobility: Supine to Sit;Sit to Supine     Supine to sit: Min assist Sit to supine: Min assist   General bed mobility comments: cues for sequence, use of bedrail  Transfers Overall transfer level: Needs assistance Equipment used: Rolling walker (2 wheeled) Transfers: Sit to/from Stand Sit to Stand: Min assist;From elevated surface         General transfer comment: cues for LE management and use of UEs to self assist  Ambulation/Gait Ambulation/Gait assistance: Min assist Ambulation Distance (Feet): 88 Feet Assistive device: Rolling walker (2 wheeled) Gait Pattern/deviations: Step-to pattern;Decreased step length - right;Decreased step length - left;Shuffle;Trunk flexed Gait velocity: decr Gait velocity interpretation: Below normal speed for age/gender General Gait Details: cues for sequence, posture and position from [employer]            Wheelchair Mobility    Modified Rankin (Stroke Patients Only)        Balance Overall balance assessment: No apparent balance deficits (not formally assessed)                                           Pertinent Vitals/Pain Pain Assessment: 0-10 Pain Score: 5  Pain Location: L knee Pain Descriptors / Indicators: Aching;Sore Pain Intervention(s): Monitored during session;Limited activity within patient's tolerance;Premedicated before session;Ice applied    Home Living Family/patient expects to be discharged to:: Private residence Living Arrangements: Spouse/significant other Available Help at Discharge: Family Type of Home: House Home Access: Stairs to enter Entrance Stairs-Rails: Right;Left;None Technical brewer of Steps: 2+3 Home Layout: Able to live on main level with bedroom/bathroom Home Equipment: Walker - 2 wheels;Cane - single point Additional Comments: has elevated toilet seat, but it clamps on and is not secure    Prior Function Level of Independence: Independent               Hand Dominance        Extremity/Trunk Assessment   Upper Extremity Assessment Upper Extremity Assessment: Overall WFL for tasks assessed    Lower Extremity Assessment Lower Extremity Assessment: LLE deficits/detail       Communication   Communication: No difficulties  Cognition Arousal/Alertness: Awake/alert Behavior During Therapy: WFL for tasks assessed/performed Overall Cognitive Status: Within Functional Limits for tasks assessed  General Comments      Exercises Total Joint Exercises Ankle Circles/Pumps: AROM;Both;15 reps;Supine   Assessment/Plan    PT Assessment Patient needs continued PT services  PT Problem List Decreased strength;Decreased range of motion;Decreased activity tolerance;Decreased mobility;Decreased knowledge of use of DME;Pain;Obesity       PT Treatment Interventions DME instruction;Gait training;Stair training;Functional mobility  training;Therapeutic activities;Therapeutic exercise;Patient/family education    PT Goals (Current goals can be found in the Care Plan section)  Acute Rehab PT Goals Patient Stated Goal: Regain IND PT Goal Formulation: With patient Time For Goal Achievement: 01/07/18 Potential to Achieve Goals: Good    Frequency 7X/week   Barriers to discharge        Co-evaluation               AM-PAC PT "6 Clicks" Daily Activity  Outcome Measure Difficulty turning over in bed (including adjusting bedclothes, sheets and blankets)?: Unable Difficulty moving from lying on back to sitting on the side of the bed? : Unable Difficulty sitting down on and standing up from a chair with arms (e.g., wheelchair, bedside commode, etc,.)?: Unable Help needed moving to and from a bed to chair (including a wheelchair)?: A Little Help needed walking in hospital room?: A Little Help needed climbing 3-5 steps with a railing? : A Lot 6 Click Score: 11    End of Session Equipment Utilized During Treatment: Gait belt;Left knee immobilizer Activity Tolerance: Patient tolerated treatment well Patient left: in bed;with call bell/phone within reach;with nursing/sitter in room Nurse Communication: Mobility status PT Visit Diagnosis: Difficulty in walking, not elsewhere classified (R26.2)    Time: 1710-1738 PT Time Calculation (min) (ACUTE ONLY): 28 min   Charges:   PT Evaluation $PT Eval Low Complexity: 1 Low PT Treatments $Gait Training: 8-22 mins   PT G Codes:        Pg [phone]   Eston Heslin 12/31/2017, 5:54 PM

## 2017-12-31 NOTE — Discharge Instructions (Signed)

## 2017-12-31 NOTE — Brief Op Note (Signed)
12/31/2017  12:40 PM  PATIENT:  Lawrence [name]  62 y.o. male  PRE-OPERATIVE DIAGNOSIS:  Left knee degenerative joint disease  POST-OPERATIVE DIAGNOSIS:  Left knee degenerative joint disease  PROCEDURE:  Procedure(s) with comments: LEFT TOTAL KNEE ARTHROPLASTY (Left) - 2.5 hrs with block  SURGEON:  Surgeon(s) and Role:    Susa Day, MD - Primary  PHYSICIAN ASSISTANT:   ASSISTANTS: Bissell   ANESTHESIA:   general  EBL:  200 mL   BLOOD ADMINISTERED:none  DRAINS: none   LOCAL MEDICATIONS USED:  MARCAINE     SPECIMEN:  No Specimen  DISPOSITION OF SPECIMEN:  N/A  COUNTS:  YES  TOURNIQUET:   Total Tourniquet Time Documented: Thigh (Left) - 75 minutes Total: Thigh (Left) - 75 minutes   DICTATION: .Other Dictation: Dictation Number  [id number]  PLAN OF CARE: Admit for overnight observation  PATIENT DISPOSITION:  PACU - hemodynamically stable.   Delay start of Pharmacological VTE agent (>24hrs) due to surgical blood loss or risk of bleeding: no

## 2017-12-31 NOTE — Progress Notes (Addendum)
Mr Lawrence [name] does not want EKG done. Explained we do not have one in our system since 2016. States had on at [facility] 06/2017. Patient comes across as aggitated and escalates with explanation of why we need it, .and is emphatic that he does not need it.  EKG report pulled up from [facility]/ no cardiac history. Reviewed with Dr Smith Robert. Per Dr Zorita Pang Order for EKG.

## 2017-12-31 NOTE — Anesthesia Procedure Notes (Signed)
Anesthesia Regional Block: Adductor canal block   Pre-Anesthetic Checklist: ,, timeout performed, Correct Patient, Correct Site, Correct Laterality, Correct Procedure, Correct Position, site marked, Risks and benefits discussed,  Surgical consent,  Pre-op evaluation,  At surgeon's request and post-op pain management  Laterality: Left  Prep: chloraprep       Needles:  Injection technique: Single-shot  Needle Type: Echogenic Needle     Needle Length: 9cm  Needle Gauge: 21     Additional Needles:   Procedures:,,,, ultrasound used (permanent image in chart),,,,  Narrative:  Start time: 12/31/2017 9:35 AM End time: 12/31/2017 9:42 AM Injection made incrementally with aspirations every 5 mL.  Performed by: Personally  Anesthesiologist: Effie Berkshire, MD  Additional Notes: Patient tolerated the procedure well. Local anesthetic introduced in an incremental fashion under minimal resistance after negative aspirations. No paresthesias were elicited. After completion of the procedure, no acute issues were identified and patient continued to be monitored by RN.

## 2017-12-31 NOTE — Transfer of Care (Signed)
Immediate Anesthesia Transfer of Care Note  Patient: Taiwo Fish  Procedure(s) Performed: LEFT TOTAL KNEE ARTHROPLASTY (Left Knee)  Patient Location: PACU  Anesthesia Type:MAC, Regional and Spinal  Level of Consciousness: awake and patient cooperative  Airway & Oxygen Therapy: Patient Spontanous Breathing and Patient connected to face mask oxygen  Post-op Assessment: Report given to RN and Post -op Vital signs reviewed and stable  Post vital signs: Reviewed and stable  Last Vitals:  Vitals:   12/31/17 0949 12/31/17 0950  BP:    Pulse: 63 61  Resp: 14 15  Temp:    SpO2: 97% 99%    Last Pain:  Vitals:   12/31/17 0725  TempSrc: Oral      Patients Stated Pain Goal: 4 (29/56/21 3086)  Complications: No apparent anesthesia complications

## 2017-12-31 NOTE — Anesthesia Postprocedure Evaluation (Signed)
Anesthesia Post Note  Patient: Lawrence [name]  Procedure(s) Performed: LEFT TOTAL KNEE ARTHROPLASTY (Left Knee)     Patient location during evaluation: PACU Anesthesia Type: Spinal Level of consciousness: oriented and awake and alert Pain management: pain level controlled Vital Signs Assessment: post-procedure vital signs reviewed and stable Respiratory status: spontaneous breathing, respiratory function stable and patient connected to nasal cannula oxygen Cardiovascular status: blood pressure returned to baseline and stable Postop Assessment: no headache, no backache, no apparent nausea or vomiting, spinal receding and patient able to bend at knees Anesthetic complications: no    Last Vitals:  Vitals:   12/31/17 1452 12/31/17 1556  BP: (!) 153/82 135/85  Pulse: (!) 53 (!) 58  Resp: 12 16  Temp: 36.7 C 36.4 C  SpO2: 95% 98%                 Effie Berkshire

## 2017-12-31 NOTE — Progress Notes (Signed)
Assisted Dr. Hollis with left, ultrasound guided, adductor canal block. Side rails up, monitors on throughout procedure. See vital signs in flow sheet. Tolerated Procedure well.  

## 2018-01-01 ENCOUNTER — Encounter (HOSPITAL_COMMUNITY): Payer: Self-pay | Admitting: Specialist

## 2018-01-01 LAB — CBC
HCT: 42.4 % (ref 39.0–52.0)
Hemoglobin: 14 g/dL (ref 13.0–17.0)
MCH: 28.9 pg (ref 26.0–34.0)
MCHC: 33 g/dL (ref 30.0–36.0)
MCV: 87.6 fL (ref 78.0–100.0)
PLATELETS: 240 10*3/uL (ref 150–400)
RBC: 4.84 MIL/uL (ref 4.22–5.81)
RDW: 13.7 % (ref 11.5–15.5)
WBC: 12.3 10*3/uL — AB (ref 4.0–10.5)

## 2018-01-01 LAB — BASIC METABOLIC PANEL
ANION GAP: 9 (ref 5–15)
BUN: 19 mg/dL (ref 6–20)
CHLORIDE: 105 mmol/L (ref 101–111)
CO2: 25 mmol/L (ref 22–32)
Calcium: 8.8 mg/dL — ABNORMAL LOW (ref 8.9–10.3)
Creatinine, Ser: 1.23 mg/dL (ref 0.61–1.24)
GFR calc Af Amer: >60 mL/min (ref >60)
GFR calc non Af Amer: >60 mL/min (ref >60)
GLUCOSE: 144 mg/dL — AB (ref 65–99)
POTASSIUM: 4.1 mmol/L (ref 3.5–5.1)
Sodium: 139 mmol/L (ref 135–145)

## 2018-01-01 MED ORDER — LINAGLIPTIN 5 MG PO TABS
5.0000 mg | ORAL_TABLET | ORAL | Status: DC
  Administered 2018-01-02: 09:00:00 5 mg via ORAL
  Filled 2018-01-01: qty 1

## 2018-01-01 NOTE — Plan of Care (Signed)
Reviewed plan of care with pt, specifically safety, pain control, and importance of notifying RN with any questions or concerns. Pt attentive and verbalized understanding of all education.

## 2018-01-01 NOTE — Progress Notes (Signed)
Patient ID: Lawrence [name], male   DOB: May 25, 1956, 62 y.o.   MRN: [id number] Subjective: 1 Day Post-Op Procedure(s) (LRB): LEFT TOTAL KNEE ARTHROPLASTY (Left) Patient reports pain as moderate.    Patient has complaints of L knee pain  We will start therapy today. Plan is to go home after hospital stay. Seen this AM by Dr. Tonita Cong  Objective: Vital signs in last 24 hours: Temp:  [97.5 F (36.4 C)-99.2 F (37.3 C)] 98.5 F (36.9 C) (03/01 0926) Pulse Rate:  [53-77] 69 (03/01 0926) Resp:  [11-18] 18 (03/01 0926) BP: (124-173)/(65-85) 144/65 (03/01 0926) SpO2:  [93 %-99 %] 96 % (03/01 0926)  Intake/Output from previous day:  Intake/Output Summary (Last 24 hours) at 01/01/2018 1118 Last data filed at 01/01/2018 1108 Gross per 24 hour  Intake 3151.67 ml  Output 4575 ml  Net -1423.33 ml    Intake/Output this shift: Total I/O In: 480 [P.O.:480] Out: 825 [Urine:825]  Labs: Results for orders placed or performed during the hospital encounter of 12/31/17  Glucose, capillary  Result Value Ref Range   Glucose-Capillary 111 (H) 65 - 99 mg/dL  Glucose, capillary  Result Value Ref Range   Glucose-Capillary 126 (H) 65 - 99 mg/dL  Glucose, capillary  Result Value Ref Range   Glucose-Capillary 122 (H) 65 - 99 mg/dL  CBC  Result Value Ref Range   WBC 12.3 (H) 4.0 - 10.5 K/uL   RBC 4.84 4.22 - 5.81 MIL/uL   Hemoglobin 14.0 13.0 - 17.0 g/dL   HCT 42.4 39.0 - 52.0 %   MCV 87.6 78.0 - 100.0 fL   MCH 28.9 26.0 - 34.0 pg   MCHC 33.0 30.0 - 36.0 g/dL   RDW 13.7 11.5 - 15.5 %   Platelets 240 150 - 400 K/uL  Basic metabolic panel  Result Value Ref Range   Sodium 139 135 - 145 mmol/L   Potassium 4.1 3.5 - 5.1 mmol/L   Chloride 105 101 - 111 mmol/L   CO2 25 22 - 32 mmol/L   Glucose, Bld 144 (H) 65 - 99 mg/dL   BUN 19 6 - 20 mg/dL   Creatinine, Ser 1.23 0.61 - 1.24 mg/dL   Calcium 8.8 (L) 8.9 - 10.3 mg/dL   GFR calc non Af Amer >60 >60 mL/min   GFR calc Af Amer >60 >60 mL/min   Anion gap 9 5 - 15    Exam - Neurologically intact ABD soft Neurovascular intact Sensation intact distally Intact pulses distally Dorsiflexion/Plantar flexion intact Incision: dressing C/D/I No cellulitis present Compartment soft no sign of DVT Dressing - clean, dry, no drainage Motor function intact - moving foot and toes well on exam.   Assessment/Plan: 1 Day Post-Op Procedure(s) (LRB): LEFT TOTAL KNEE ARTHROPLASTY (Left)  Advance diet Up with therapy D/C IV fluids Past Medical History:  Diagnosis Date  . Acute renal insufficiency 2016   history of  . Anemia    iron deficiency  . Arthritis    osteoarthritis both knees  . Bilateral cataracts   . Cancer ([place])    kidney  . Cellulitis    leg  . 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 Chronic kidney disease 2016   bil renal mass, CKD stage 3  . Colon polyp   . Diabetes mellitus without complication ([place])    type II  . Dyslipidemia   . Erectile dysfunction   . Hypertension   . Hypogonadism in male   . Morbid obesity ([place])   .  Non-traumatic rhabdomyolysis    during surgery  . Renal cyst 2016   bilateral  . Respiratory failure ([place]) 06/20/2017  . 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 Sinusitis   . Sleep apnea    uses c-pap machine  . Varicose veins of legs   . Vitamin B 12 deficiency     DVT Prophylaxis - ASA 325mg  BID Protocol Weight-Bearing as tolerated to L leg No vaccines. Ok for gabapentin with Keppra - Dr Tonita Cong discussed with pharmacy   Lacie Draft M. 01/01/2018, 11:18 AM

## 2018-01-01 NOTE — Progress Notes (Signed)
Nurse paged Dierdre Highman, PA to request sliding scale insulin to be discontinued and Januvia to be started per patients request. Per Dierdre Highman, discontinue sliding scale insulin and start Januvia. Nurse discontinued sliding scale insulin. Nurse called pharmacy concerning restarting Januvia. Per Louie Casa in pharmacy, he will restart Januvia, due to formulary guidelines.

## 2018-01-01 NOTE — Progress Notes (Signed)
OT Cancellation Note  Patient Details Name: Lawrence [name] MRN: [id number] DOB: 1955/12/09   Cancelled Treatment:    Reason Eval/Treat Not Completed: PT screened, no needs identified, will sign off.  Pt recently had other knee replaced.  Cele Mote 01/01/2018, 7:12 AM  Lesle Chris, OTR/L [phone] 01/01/2018

## 2018-01-01 NOTE — Op Note (Signed)
NAME:  Lawrence [name], SEIDMAN                   ACCOUNT NO.:  MEDICAL RECORD NO.:  [id number]  LOCATION:                                 FACILITY:  PHYSICIAN:  Susa Day, M.D.         DATE OF BIRTH:  DATE OF PROCEDURE:  12/31/2017 DATE OF DISCHARGE:                              OPERATIVE REPORT   PREOPERATIVE DIAGNOSIS:  End-stage osteoarthritis of the left knee.  POSTOPERATIVE DIAGNOSIS:  End-stage osteoarthritis of the left knee.  PROCEDURE PERFORMED:  Left total knee arthroplasty utilizing DePuy Attune 9 femur, 8 tibia, 5 insert, 41 patella.  ANESTHESIA:  Spinal followed by general.  ASSISTANT:  Lacie Draft, PA.  HISTORY:  This is a 62 year old male with end-stage osteoarthrosis, medial compartment of left knee varus deformity.  He is recently status post a total knee arthroplasty on the right, had done well.  The patient had multiple years of conservative treatment in an effort to reduce pain, improve his mobility and to reduce his BMI.  He had nutritional counselling attempts at reducing his BMI.  This is over a 2- year period of time.  He was unable due to the severity of the pain and the failure of alleviation by conservative methods to continue with his weight reduction program by exercise.  We therefore discussed the total knee arthroplasties bilaterally in an effort to regain his mobility for that eventual goal.  We did discuss the risks and benefits including bleeding, infection, damage to the neurovascular structures, no change in symptoms, worsening symptoms, DVT, PE, anesthetic complications, etc.  TECHNIQUE:  With the patient in supine position after induction of adequate spinal anesthesia followed by supplemental general, left lower extremity was prepped, draped and exsanguinated in usual sterile fashion.  Thigh tourniquet inflated to 75 mmHg.  Midline incision was then made over the knee on the left.  Full-thickness flaps developed. Median parapatellar  arthrotomy performed.  Elevated the soft tissues medially, preserving the MCL, cauterizing when necessary.  Patella everted and knee flexed.  Tricompartmental osteoarthrosis was noted particularly in the medial compartment.  Osteophytes removed with a rongeur.  Remnants of the medial and lateral menisci removed as well as ACL.  Step drill was utilized to enter the femur.  We irrigated and a 5- degree left, 9 off the distal femur was utilized, it was measured.  We performed our distal femoral cut.  This was then sized off the lateral ridge to a 9, pinned and 3 degrees of external rotation.  Anterior, posterior and chamfer cuts were performed protecting the soft tissues posteriorly at all times.  We then subluxed the tibia.  External alignment guide was utilized, 2 off the defect posteromedially, bisecting the ankle, 3-degree slope parallel to the shaft, performed our cut.  An extension block was slightly tight.  We therefore removed an additional 2 mm off the proximal tibia as it was tight in flexion and extension.  It was satisfactory following that.  I then flexed the knee, subluxed the tibia, sized it to an 8 just to the medial aspect of the tibial tubercle, pinned it, drilled it centrally after harvesting bone, used our punch guide.  I then placed a bone in the femoral canal.  We then used our box-cutting guide, flushed to the lateral aspect of the femur.  This was then pinned.  We performed our box cut.  We placed our trial femur and drilled our lug holes and then an insert trial at 5 mm and we had full extension, full flexion, good stability, varus and valgus stressing at 0-30 degrees and negative anterior drawer.  We protected the soft tissues at all times.  Just prior to that, we had to meticulously remove osteophytes from the posterior femur, protecting it with a curved Crego.  After this, we measured the patella to a 29-30 in its thickness.  We planed it to a 21.  Utilizing the  external alignment guide and we measured it a 41, medializing our PEG holes, drilling them and then placed a trial patella, reduced it and had excellent patellofemoral tracking.  We then removed all instrumentation, pulsatile lavage to clean the wound, flexed the knee, all surfaces were thoroughly dried.  Subluxed the tibia and mixed cement on the back table in appropriate fashion and then injected it and digitally pressurized it into the proximal tibia.  We impacted the tibial tray, redundant cement removed.  We cemented and impacted the femur, redundant cement removed. We inserted the polyethylene insert.  Reduced it and held an axial load throughout the curing of the cement with a redundant cement removed.  We cemented the patella and clamped it.  After curing of the cement, tourniquet was deflated at 75 minutes.  Good flexion, good extension, negative anterior drawer, good stability of varus-valgus stressing at 0- 30 degrees.  We removed the insert, cauterized the capsule, geniculates and deep soft tissue with a Mantis coagulator.  Copiously irrigated with antibiotic irrigation, placed a permanent 5-mm insert, reduced it, had full extension, full flexion, good stability of varus and valgus stressing at 0-30 degrees, negative anterior drawer.  I then injected in the periosteum with 0.25% Marcaine with epinephrine, reapproximated the patellar arthrotomy in slight flexion of the knee, with #1 Vicryl interrupted figure-of-eight sutures, oversewn with a running Stratafix, subcu with 2-0 and skin with staples.  It flexion to gravity at 90 degrees prior to that, full extension, full flexion, good patellofemoral tracking.  No instability.  The patient was placed in a sterile dressing, immobilizer, woken without difficulty and transported to the recovery room in satisfactory condition.  The patient tolerated the procedure well.  No complications.  Assistant, Lacie Draft, [place].  Minimal  blood loss.     Susa Day, M.D.     Geralynn Rile  D:  12/31/2017  T:  01/01/2018  Job:  [id number]

## 2018-01-01 NOTE — Progress Notes (Signed)
Physical Therapy Treatment Patient Details Name: Lawrence [name] MRN: [id number] DOB: Apr 04, 1956 Today's Date: 01/01/2018    History of Present Illness Pt s/p L TKR and with hx of R TKR, DM and CKD    PT Comments    Pt motivated and progressing steadily with mobility.   Follow Up Recommendations  Follow surgeon's recommendation for DC plan and follow-up therapies     Equipment Recommendations  None recommended by PT    Recommendations for Other Services OT consult     Precautions / Restrictions Precautions Precautions: Fall;Knee Required Braces or Orthoses: Knee Immobilizer - Left Knee Immobilizer - Left: Discontinue once straight leg raise with < 10 degree lag Restrictions Weight Bearing Restrictions: No Other Position/Activity Restrictions: WBAT    Mobility  Bed Mobility Overal bed mobility: Needs Assistance Bed Mobility: Supine to Sit;Sit to Supine     Supine to sit: Min assist Sit to supine: Min assist   General bed mobility comments: cues for sequence with assist to manage L LE  Transfers Overall transfer level: Needs assistance Equipment used: Rolling walker (2 wheeled) Transfers: Sit to/from Stand Sit to Stand: Min assist;Min guard         General transfer comment: cues for LE management and use of UEs to self assist  Ambulation/Gait Ambulation/Gait assistance: Min assist;Min guard Ambulation Distance (Feet): 147 Feet Assistive device: Rolling walker (2 wheeled) Gait Pattern/deviations: Step-to pattern;Decreased step length - right;Decreased step length - left;Shuffle;Trunk flexed Gait velocity: decr Gait velocity interpretation: Below normal speed for age/gender General Gait Details: cues for sequence, posture and position from [employer]            Wheelchair Mobility    Modified Rankin (Stroke Patients Only)       Balance Overall balance assessment: No apparent balance deficits (not formally assessed)                                           Cognition Arousal/Alertness: Awake/alert Behavior During Therapy: WFL for tasks assessed/performed Overall Cognitive Status: Within Functional Limits for tasks assessed                                        Exercises Total Joint Exercises Ankle Circles/Pumps: AROM;Both;15 reps;Supine Quad Sets: AROM;Both;10 reps;Supine Heel Slides: AAROM;Left;10 reps;Supine Straight Leg Raises: AAROM;Left;15 reps;Supine Goniometric ROM: AAROM R knee -10 - 45    General Comments        Pertinent Vitals/Pain Pain Assessment: 0-10 Pain Score: 4  Pain Location: L knee Pain Descriptors / Indicators: Aching;Sore Pain Intervention(s): Limited activity within patient's tolerance;Monitored during session;Premedicated before session;Ice applied    Home Living                      Prior Function            PT Goals (current goals can now be found in the care plan section) Acute Rehab PT Goals Patient Stated Goal: Regain IND PT Goal Formulation: With patient Time For Goal Achievement: 01/07/18 Potential to Achieve Goals: Good Progress towards PT goals: Progressing toward goals    Frequency    7X/week      PT Plan Current plan remains appropriate    Co-evaluation  AM-PAC PT "6 Clicks" Daily Activity  Outcome Measure  Difficulty turning over in bed (including adjusting bedclothes, sheets and blankets)?: Unable Difficulty moving from lying on back to sitting on the side of the bed? : Unable Difficulty sitting down on and standing up from a chair with arms (e.g., wheelchair, bedside commode, etc,.)?: Unable Help needed moving to and from a bed to chair (including a wheelchair)?: A Little Help needed walking in hospital room?: A Little Help needed climbing 3-5 steps with a railing? : A Little 6 Click Score: 12    End of Session Equipment Utilized During Treatment: Gait belt;Left knee immobilizer Activity  Tolerance: Patient tolerated treatment well Patient left: in bed;with call bell/phone within reach;with nursing/sitter in room Nurse Communication: Mobility status PT Visit Diagnosis: Difficulty in walking, not elsewhere classified (R26.2)     Time: 4163-8453 PT Time Calculation (min) (ACUTE ONLY): 43 min  Charges:  $Gait Training: 8-22 mins $Therapeutic Exercise: 8-22 mins                    G Codes:       Pg [phone]    Lawrence [name] 01/01/2018, 2:23 PM

## 2018-01-01 NOTE — Progress Notes (Signed)
Physical Therapy Treatment Patient Details Name: Lawrence [name] MRN: [id number] DOB: November 02, 1956 Today's Date: 01/01/2018    History of Present Illness Pt s/p L TKR and with hx of R TKR, DM and CKD    PT Comments    Pt motivated and progressing well with mobility.  Pt with increased pain this date but hopeful for dc home fomorrow.   Follow Up Recommendations  Follow surgeon's recommendation for DC plan and follow-up therapies     Equipment Recommendations  None recommended by PT    Recommendations for Other Services OT consult     Precautions / Restrictions Precautions Precautions: Fall;Knee Required Braces or Orthoses: Knee Immobilizer - Left Knee Immobilizer - Left: Discontinue once straight leg raise with < 10 degree lag Restrictions Weight Bearing Restrictions: No Other Position/Activity Restrictions: WBAT    Mobility  Bed Mobility Overal bed mobility: Needs Assistance Bed Mobility: Sit to Supine       Sit to supine: Min assist   General bed mobility comments: cues for sequence with assist to manage L LE  Transfers Overall transfer level: Needs assistance Equipment used: Rolling walker (2 wheeled) Transfers: Sit to/from Stand Sit to Stand: Min assist         General transfer comment: cues for LE management and use of UEs to self assist  Ambulation/Gait Ambulation/Gait assistance: Min assist Ambulation Distance (Feet): 120 Feet Assistive device: Rolling walker (2 wheeled) Gait Pattern/deviations: Step-to pattern;Decreased step length - right;Decreased step length - left;Shuffle;Trunk flexed Gait velocity: decr Gait velocity interpretation: Below normal speed for age/gender General Gait Details: cues for sequence, posture and position from [employer]            Wheelchair Mobility    Modified Rankin (Stroke Patients Only)       Balance                                            Cognition Arousal/Alertness:  Awake/alert Behavior During Therapy: WFL for tasks assessed/performed Overall Cognitive Status: Within Functional Limits for tasks assessed                                        Exercises Total Joint Exercises Ankle Circles/Pumps: AROM;Both;15 reps;Supine Quad Sets: AROM;Both;10 reps;Supine Heel Slides: AAROM;Left;10 reps;Supine Straight Leg Raises: AAROM;Left;15 reps;Supine Goniometric ROM: AAROM R knee -10 - 45    General Comments        Pertinent Vitals/Pain Pain Assessment: 0-10 Pain Score: 5  Pain Location: L knee Pain Descriptors / Indicators: Aching;Sore Pain Intervention(s): Limited activity within patient's tolerance;Monitored during session;Premedicated before session;Ice applied    Home Living                      Prior Function            PT Goals (current goals can now be found in the care plan section) Acute Rehab PT Goals Patient Stated Goal: Regain IND PT Goal Formulation: With patient Time For Goal Achievement: 01/07/18 Potential to Achieve Goals: Good Progress towards PT goals: Progressing toward goals    Frequency    7X/week      PT Plan Current plan remains appropriate    Co-evaluation  AM-PAC PT "6 Clicks" Daily Activity  Outcome Measure  Difficulty turning over in bed (including adjusting bedclothes, sheets and blankets)?: Unable Difficulty moving from lying on back to sitting on the side of the bed? : Unable Difficulty sitting down on and standing up from a chair with arms (e.g., wheelchair, bedside commode, etc,.)?: Unable Help needed moving to and from a bed to chair (including a wheelchair)?: A Little Help needed walking in hospital room?: A Little Help needed climbing 3-5 steps with a railing? : A Lot 6 Click Score: 11    End of Session Equipment Utilized During Treatment: Gait belt;Left knee immobilizer Activity Tolerance: Patient tolerated treatment well Patient left: in bed;with  call bell/phone within reach;with nursing/sitter in room Nurse Communication: Mobility status PT Visit Diagnosis: Difficulty in walking, not elsewhere classified (R26.2)     Time: 9574-7340 PT Time Calculation (min) (ACUTE ONLY): 34 min  Charges:  $Gait Training: 8-22 mins $Therapeutic Exercise: 8-22 mins                    G Codes:       Pg [phone]    Lawrence [name] 01/01/2018, 12:19 PM

## 2018-01-02 LAB — BASIC METABOLIC PANEL
Anion gap: 9 (ref 5–15)
BUN: 16 mg/dL (ref 6–20)
CALCIUM: 8.6 mg/dL — AB (ref 8.9–10.3)
CO2: 27 mmol/L (ref 22–32)
CREATININE: 1.14 mg/dL (ref 0.61–1.24)
Chloride: 102 mmol/L (ref 101–111)
GFR calc Af Amer: >60 mL/min (ref >60)
GLUCOSE: 131 mg/dL — AB (ref 65–99)
Potassium: 4 mmol/L (ref 3.5–5.1)
Sodium: 138 mmol/L (ref 135–145)

## 2018-01-02 LAB — CBC
HCT: 43.7 % (ref 39.0–52.0)
Hemoglobin: 14.4 g/dL (ref 13.0–17.0)
MCH: 29 pg (ref 26.0–34.0)
MCHC: 33 g/dL (ref 30.0–36.0)
MCV: 87.9 fL (ref 78.0–100.0)
PLATELETS: 206 10*3/uL (ref 150–400)
RBC: 4.97 MIL/uL (ref 4.22–5.81)
RDW: 14 % (ref 11.5–15.5)
WBC: 10.5 10*3/uL (ref 4.0–10.5)

## 2018-01-02 NOTE — Progress Notes (Signed)
Subjective: 2 Days Post-Op Procedure(s) (LRB): LEFT TOTAL KNEE ARTHROPLASTY (Left) Patient reports pain as 3 on 0-10 scale.    Objective: Vital signs in last 24 hours: Temp:  [98.5 F (36.9 C)-100 F (37.8 C)] 99.7 F (37.6 C) (03/02 0613) Pulse Rate:  [67-80] 80 (03/02 0613) Resp:  [16-18] 16 (03/02 0613) BP: (144-161)/(65-81) 153/79 (03/02 0613) SpO2:  [93 %-97 %] 93 % (03/02 0613)  Intake/Output from previous day: 03/01 0701 - 03/02 0700 In: 1560 [P.O.:1560] Out: 4325 [Urine:4325] Intake/Output this shift: Total I/O In: -  Out: 400 [Urine:400]  Recent Labs    01/01/18 0730 01/02/18 0524  HGB 14.0 14.4   Recent Labs    01/01/18 0730 01/02/18 0524  WBC 12.3* 10.5  RBC 4.84 4.97  HCT 42.4 43.7  PLT 240 206   Recent Labs    01/01/18 0730 01/02/18 0524  NA 139 138  K 4.1 4.0  CL 105 102  CO2 25 27  BUN 19 16  CREATININE 1.23 1.14  GLUCOSE 144* 131*  CALCIUM 8.8* 8.6*   No results for input(s): LABPT, INR in the last 72 hours.  Neurologically intact Sensation intact distally Dorsiflexion/Plantar flexion intact Incision: dressing C/D/I and no drainage No DVT. Assessment/Plan: 2 Days Post-Op Procedure(s) (LRB): LEFT TOTAL KNEE ARTHROPLASTY (Left) Advance diet Discharge home with home health  Instructions given  Lawrence [name] C 01/02/2018, 8:09 AM

## 2018-01-02 NOTE — Progress Notes (Signed)
Physical Therapy Treatment Patient Details Name: Christ Fullenwider MRN: [id number] DOB: 1956/01/20 Today's Date: 01/02/2018    History of Present Illness Pt s/p L TKR and with hx of R TKR, DM and CKD    PT Comments    Pt progressing steadily with mobility.  Reviewed home therex, stairs and don/doff KI.   Follow Up Recommendations  Follow surgeon's recommendation for DC plan and follow-up therapies     Equipment Recommendations  None recommended by PT    Recommendations for Other Services OT consult     Precautions / Restrictions Precautions Precautions: Fall;Knee Required Braces or Orthoses: Knee Immobilizer - Left Knee Immobilizer - Left: Discontinue once straight leg raise with < 10 degree lag Restrictions Weight Bearing Restrictions: No Other Position/Activity Restrictions: WBAT    Mobility  Bed Mobility Overal bed mobility: Needs Assistance Bed Mobility: Supine to Sit;Sit to Supine     Supine to sit: Min assist Sit to supine: Min assist   General bed mobility comments: cues for sequence with assist to manage L LE  Transfers Overall transfer level: Needs assistance Equipment used: Rolling walker (2 wheeled) Transfers: Sit to/from Stand Sit to Stand: Min guard         General transfer comment: cues for LE management and use of UEs to self assist  Ambulation/Gait Ambulation/Gait assistance: Min guard Ambulation Distance (Feet): 123 Feet Assistive device: Rolling walker (2 wheeled) Gait Pattern/deviations: Step-to pattern;Decreased step length - right;Decreased step length - left;Shuffle;Trunk flexed Gait velocity: decr Gait velocity interpretation: Below normal speed for age/gender General Gait Details: cues for sequence, posture and position from RW   Stairs Stairs: Yes   Stair Management: No rails;One rail Left;Step to pattern;Forwards;With cane Number of Stairs: 4 General stair comments: 2 stairs fwd with rail and canel; 2 stairs bkwd with RW;  written instruction provided  Wheelchair Mobility    Modified Rankin (Stroke Patients Only)       Balance                                            Cognition Arousal/Alertness: Awake/alert Behavior During Therapy: WFL for tasks assessed/performed Overall Cognitive Status: Within Functional Limits for tasks assessed                                        Exercises Total Joint Exercises Ankle Circles/Pumps: AROM;Both;15 reps;Supine Quad Sets: AROM;Both;10 reps;Supine Heel Slides: AAROM;Left;Supine;20 reps Straight Leg Raises: AAROM;Left;Supine;20 reps Goniometric ROM: AAROM L knee -10 - 60    General Comments        Pertinent Vitals/Pain Pain Assessment: 0-10 Pain Score: 5  Pain Location: L knee Pain Descriptors / Indicators: Aching;Sore Pain Intervention(s): Limited activity within patient's tolerance;Monitored during session;Premedicated before session;Ice applied    Home Living                      Prior Function            PT Goals (current goals can now be found in the care plan section) Acute Rehab PT Goals Patient Stated Goal: Regain IND PT Goal Formulation: With patient Time For Goal Achievement: 01/07/18 Potential to Achieve Goals: Good Progress towards PT goals: Progressing toward goals    Frequency    7X/week  PT Plan Current plan remains appropriate    Co-evaluation              AM-PAC PT "6 Clicks" Daily Activity  Outcome Measure  Difficulty turning over in bed (including adjusting bedclothes, sheets and blankets)?: Unable Difficulty moving from lying on back to sitting on the side of the bed? : Unable Difficulty sitting down on and standing up from a chair with arms (e.g., wheelchair, bedside commode, etc,.)?: A Lot Help needed moving to and from a bed to chair (including a wheelchair)?: A Little Help needed walking in hospital room?: A Little Help needed climbing 3-5 steps with  a railing? : A Little 6 Click Score: 13    End of Session Equipment Utilized During Treatment: Gait belt;Left knee immobilizer Activity Tolerance: Patient tolerated treatment well Patient left: in bed;with call bell/phone within reach;with nursing/sitter in room Nurse Communication: Mobility status PT Visit Diagnosis: Difficulty in walking, not elsewhere classified (R26.2)     Time: 1191-4782 PT Time Calculation (min) (ACUTE ONLY): 38 min  Charges:  $Gait Training: 8-22 mins $Therapeutic Exercise: 8-22 mins $Therapeutic Activity: 8-22 mins                    G Codes:       Pg [phone]    Bob Eastwood 01/02/2018, 12:08 PM

## 2018-01-02 NOTE — Progress Notes (Signed)
Pt to d/c home. No DME needs. AVS reviewed and "My Chart" discussed with pt. Pt capable of verbalizing medications, signs and symptoms of infection, and follow-up appointments. Remains hemodynamically stable. No signs and symptoms of distress. Educated pt to return to ER in the case of SOB, dizziness, or chest pain.

## 2018-01-03 LAB — GLUCOSE, CAPILLARY
GLUCOSE-CAPILLARY: 126 mg/dL — AB (ref 65–99)
Glucose-Capillary: 142 mg/dL — ABNORMAL HIGH (ref 65–99)

## 2018-01-15 DIAGNOSIS — M25562 Pain in left knee: Secondary | ICD-10-CM | POA: Insufficient documentation

## 2018-03-10 ENCOUNTER — Encounter: Payer: Self-pay | Admitting: Neurology

## 2018-09-28 ENCOUNTER — Ambulatory Visit: Payer: Managed Care, Other (non HMO) | Admitting: Neurology

## 2019-09-14 DIAGNOSIS — E119 Type 2 diabetes mellitus without complications: Secondary | ICD-10-CM | POA: Insufficient documentation

## 2019-09-15 ENCOUNTER — Ambulatory Visit: Payer: Managed Care, Other (non HMO) | Admitting: Sports Medicine

## 2019-09-15 ENCOUNTER — Other Ambulatory Visit: Payer: Self-pay

## 2019-09-15 ENCOUNTER — Encounter: Payer: Self-pay | Admitting: Sports Medicine

## 2019-09-15 DIAGNOSIS — M79674 Pain in right toe(s): Secondary | ICD-10-CM | POA: Diagnosis not present

## 2019-09-15 DIAGNOSIS — E119 Type 2 diabetes mellitus without complications: Secondary | ICD-10-CM | POA: Diagnosis not present

## 2019-09-15 DIAGNOSIS — M79675 Pain in left toe(s): Secondary | ICD-10-CM | POA: Diagnosis not present

## 2019-09-15 DIAGNOSIS — L602 Onychogryphosis: Secondary | ICD-10-CM | POA: Diagnosis not present

## 2019-09-15 MED ORDER — AMOXICILLIN-POT CLAVULANATE 875-125 MG PO TABS: 1.0000 | ORAL_TABLET | Freq: Two times a day (BID) | ORAL | 0 refills | Status: AC

## 2019-09-15 MED ORDER — NEOMYCIN-POLYMYXIN-HC 3.5-10000-1 OT SOLN: OTIC | 0 refills | Status: AC

## 2019-09-15 NOTE — Progress Notes (Signed)
Subjective: Lawrence [name] is a 63 y.o. male patient presents to office today complaining of a moderately painful date and overgrown left greater than right first toenail.  Patient reports that this has been issues for many years reports that when he was younger dropped a brick on the left toenail and always had issues with the way it grew also reports that he has had in the past issues with ingrown toenails and had it treated by Dr. Blenda Mounts.  Patient reports that he has tried trimming but the big toenails are very thick very difficult to manage.  Patient reports that he is diabetic and his last blood sugar was 120 last A1c 7.1 last visit to PCP was 09/10/2019.Patient denies fever/chills/nausea/vomitting/any other related constitutional symptoms at this time.  Review of Systems  All other systems reviewed and are negative.    Patient Active Problem List   Diagnosis Date Noted  . Diabetes mellitus ([place]) 09/14/2019  . Primary osteoarthritis of left knee 12/31/2017  . History of total knee arthroplasty 12/03/2017  . 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 Cerebral vascular malformation 06/29/2017  . Localization-related (focal) (partial) symptomatic epilepsy and epileptic syndromes with complex partial seizures, intractable, without status epilepticus ([place]) 06/20/2017  . Benign essential hypertension 03/26/2017  . Hyperlipidemia 03/26/2017  . Morbid obesity due to excess calories ([place]) 03/26/2017  . Renal cell cancer ([place]) 10/17/2015  . Left renal mass   . Renal mass, left   . Renal mass 09/07/2015  . Varicose veins of lower extremities with other complications 56/81/2751  . 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 Venous insufficiency 09/06/2012    Current Outpatient Medications on File Prior to Visit  Medication Sig Dispense Refill  . amLODipine (NORVASC) 10 MG tablet Take 10 mg by mouth daily.    . ANDROGEL PUMP 20.25 MG/ACT (1.62%) GEL Apply 4 Pump  topically daily.     . Blood Glucose Monitoring Suppl (ONE TOUCH ULTRA 2) W/DEVICE KIT     . fenofibrate 160 MG tablet Take 160 mg by mouth daily.     . ferrous sulfate 325 (65 FE) MG EC tablet Take 325 mg by mouth daily with breakfast.    . metoprolol succinate (TOPROL-XL) 50 MG 24 hr tablet Take 50 mg by mouth every morning. Take with or immediately following a meal    . Misc Natural Products (OSTEO BI-FLEX TRIPLE STRENGTH) TABS Take 2 tablets by mouth daily.    Marland Kitchen omega-3 acid ethyl esters (LOVAZA) 1 G capsule Take 1 g by mouth daily. Reported on 11/27/2015    . sitaGLIPtin (JANUVIA) 100 MG tablet Take 100 mg by mouth daily.    . vitamin B-12 (CYANOCOBALAMIN) 100 MCG tablet Take 100 mcg by mouth daily.    . vitamin C (ASCORBIC ACID) 500 MG tablet Take 500 mg by mouth daily.     No current facility-administered medications on file prior to visit.     No Known Allergies  Objective:  There were no vitals filed for this visit.  General: Well developed, nourished, in no acute distress, alert and oriented x3   Dermatology: Skin is warm, dry and supple bilateral.  Bilateral hallux nail appears to be  severely thickened and overgrown rams horn appearance. (-) Erythema. (-) Edema. (-) serosanguous drainage present. The remaining nails appear unremarkable at this time. There are no open sores, lesions or other signs of infection present.  Vascular: Dorsalis Pedis artery and Posterior Tibial artery pedal pulses  are 1/4 bilateral with immedate capillary fill time.  Varicosities present bilateral.  Pedal hair growth present. No lower extremity edema.   Neruologic: Grossly intact via light touch bilateral.  Musculoskeletal: Tenderness to palpation of the bilateral hallux nails. Muscular strength within normal limits in all groups bilateral.   Assesement and Plan: Problem List Items Addressed This Visit    None    Visit Diagnoses    Onychogryposis of toenail    -  Primary   Toe pain, bilateral        Diabetes mellitus without complication ([place])          -Discussed treatment alternatives and plan of care; Explained permanent/temporary nail avulsion and post procedure course to patient. Patient elects for PNA bilateral hallux - After a verbal and written consent, injected 3 ml of a 50:50 mixture of 2% plain  lidocaine and 0.5% plain marcaine in a normal hallux block fashion. Next, a  betadine prep was performed. Anesthesia was tested and found to be appropriate.  The offending bilateral hallux nails were then incised from the hyponychium to the epinychium. The offending nails were removed and cleared from the field. The area was curretted for any remaining nail or spicules. Phenol application performed and the area was then flushed with alcohol and dressed with antibiotic cream and a dry sterile dressing. -Patient was instructed to leave the dressing intact for today and begin soaking  in a weak solution of betadine or Epsom salt and water tomorrow. Patient was instructed to soak for 15-20 minutes each day and apply neosporin/corticosporin and a gauze or bandaid dressing each day. -Patient was instructed to monitor the toe for signs of infection and return to office if toe becomes red, hot or swollen. -Prescribe Augmentin for preventative measures -Advised ice, elevation, and tylenol or motrin if needed for pain.  -Patient is to return in 2 weeks for follow up care/nail check or sooner if problems arise.  Landis Martins, DPM

## 2019-09-15 NOTE — Patient Instructions (Signed)

## 2019-10-06 ENCOUNTER — Ambulatory Visit: Payer: Managed Care, Other (non HMO) | Admitting: Sports Medicine

## 2020-09-19 ENCOUNTER — Other Ambulatory Visit: Payer: Self-pay

## 2020-09-19 ENCOUNTER — Encounter: Payer: Self-pay | Admitting: Sports Medicine

## 2020-09-19 ENCOUNTER — Ambulatory Visit: Payer: Managed Care, Other (non HMO) | Admitting: Sports Medicine

## 2020-09-19 DIAGNOSIS — M79675 Pain in left toe(s): Secondary | ICD-10-CM

## 2020-09-19 DIAGNOSIS — M79674 Pain in right toe(s): Secondary | ICD-10-CM

## 2020-09-19 DIAGNOSIS — B351 Tinea unguium: Secondary | ICD-10-CM

## 2020-09-19 DIAGNOSIS — E119 Type 2 diabetes mellitus without complications: Secondary | ICD-10-CM | POA: Diagnosis not present

## 2020-09-19 DIAGNOSIS — M792 Neuralgia and neuritis, unspecified: Secondary | ICD-10-CM

## 2020-09-19 DIAGNOSIS — M2041 Other hammer toe(s) (acquired), right foot: Secondary | ICD-10-CM | POA: Diagnosis not present

## 2020-09-19 DIAGNOSIS — M2142 Flat foot [pes planus] (acquired), left foot: Secondary | ICD-10-CM

## 2020-09-19 DIAGNOSIS — M2141 Flat foot [pes planus] (acquired), right foot: Secondary | ICD-10-CM

## 2020-09-19 DIAGNOSIS — M2042 Other hammer toe(s) (acquired), left foot: Secondary | ICD-10-CM

## 2020-09-19 NOTE — Patient Instructions (Signed)

## 2020-09-19 NOTE — Progress Notes (Signed)
Subjective: Lawrence [name] is a 64 y.o. male patient who presents to office for evaluation of pain to 1st, 5th toes and ball of foot that is sore, worse with shoes and by end of work day. Wore new balance this weekend and did not have pain. Pain also throbs and burns at tip at near nails on each of the toes states that he tried trimming nails but that really didn't help. Neosporin helped once. Reports that his PCP didn't think it was neuropathy. No other pedal complaints noted.   FBS better, lost weight recently.    Patient Active Problem List   Diagnosis Date Noted  . Diabetes mellitus ([place]) 09/14/2019  . Pain in left knee 01/15/2018  . Primary osteoarthritis of left knee 12/31/2017  . History of total knee arthroplasty 12/03/2017  . 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 Cerebral vascular malformation 06/29/2017  . Localization-related (focal) (partial) symptomatic epilepsy and epileptic syndromes with complex partial seizures, intractable, without status epilepticus ([place]) 06/20/2017  . Benign essential hypertension 03/26/2017  . Hyperlipidemia 03/26/2017  . Morbid obesity due to excess calories ([place]) 03/26/2017  . Renal cell cancer ([place]) 10/17/2015  . Left renal mass   . Renal mass, left   . Renal mass 09/07/2015  . Varicose veins of lower extremities with other complications 11/91/4782  . 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 Venous insufficiency 09/06/2012    Current Outpatient Medications on File Prior to Visit  Medication Sig Dispense Refill  . amLODipine (NORVASC) 10 MG tablet Take 10 mg by mouth daily.    Marland Kitchen amoxicillin-clavulanate (AUGMENTIN) 875-125 MG tablet Take 1 tablet by mouth 2 (two) times daily. 28 tablet 0  . Blood Glucose Monitoring Suppl (ONE TOUCH ULTRA 2) W/DEVICE KIT     . fenofibrate 160 MG tablet Take 160 mg by mouth daily.     . ferrous sulfate 325 (65 FE) MG EC tablet Take 325 mg by mouth daily with breakfast.    .  metoprolol succinate (TOPROL-XL) 50 MG 24 hr tablet Take 50 mg by mouth every morning. Take with or immediately following a meal    . Misc Natural Products (OSTEO BI-FLEX TRIPLE STRENGTH) TABS Take 2 tablets by mouth daily.    Marland Kitchen neomycin-polymyxin-hydrocortisone (CORTISPORIN) OTIC solution Apply 2-3 drops to the ingrown toenail site twice daily. Cover with band-aid. 10 mL 0  . omega-3 acid ethyl esters (LOVAZA) 1 G capsule Take 1 g by mouth daily. Reported on 11/27/2015    . sildenafil (REVATIO) 20 MG tablet SMARTSIG:1 By Mouth 4-5 Times Daily    . sitaGLIPtin (JANUVIA) 100 MG tablet Take 100 mg by mouth daily.    . tadalafil (CIALIS) 20 MG tablet Take 20 mg by mouth at bedtime.    Nelva Nay SOLOSTAR 300 UNIT/ML Solostar Pen Inject 20 Units into the skin at bedtime.    . vitamin B-12 (CYANOCOBALAMIN) 100 MCG tablet Take 100 mcg by mouth daily.    . vitamin C (ASCORBIC ACID) 500 MG tablet Take 500 mg by mouth daily.     No current facility-administered medications on file prior to visit.    No Known Allergies  Objective:  General: Alert and oriented x3 in no acute distress  Dermatology: No open lesions bilateral lower extremities, no webspace macerations, no ecchymosis bilateral, all nails x9 are thick but well manicured.  No acute ingrowing of 80 toenails however there is mild incurvation.  S/p bil total nail avulsions at hallux  with the sites appearing to be well-healed with very minimal keratosis dry skin or flecks of nail/spicules noted  Vascular: Dorsalis Pedis and Posterior Tibial pedal pulses palpable, Capillary Fill Time 3 seconds, normal blanchable erythema to bilateral hallux,(+) pedal hair growth bilateral, no edema bilateral lower extremities, Temperature gradient within normal limits.  Neurology: Johney Maine sensation intact via light touch bilateral, Protective sensation intact with Thornell Mule Monofilament to all pedal sites, Position sense intact, vibratory intact bilateral,  subjective sharp pains to toes questionable if there is any underlying early neuritis or neuropathy component at this time however we will treat empirically with by other means and uses as a diagnosis of exclusion if no relief with other treatments.  Musculoskeletal: Mild tenderness with palpation at fifth toes with mild varus rotation greater than first toes and diffusely to the balls of both feet.  No other structural deformities noted.   Assessment and Plan: Problem List Items Addressed This Visit    None    Visit Diagnoses    Toe pain, bilateral    -  Primary   Hammer toes of both feet       Varus 5th toe bilateral   Diabetes mellitus without complication (HCC)       Relevant Medications   TOUJEO SOLOSTAR 300 UNIT/ML Solostar Pen   Pain due to onychomycosis of toenails of both feet       Pes planus of both feet       Neuritis       Questionable      -Complete examination performed -Discussed treatement options for mechanical pain versus early onset of neuropathy -At no additional charge mechanically debrided fifth toenails and smoothed with rotary bur -Dispensed toe caps for patient to use as instructed and advised patient to return to using his [facility] if this gives him some relief to continue with this treatment or to consider going to [facility] to get super feet orthotics or new shoes however I did offer patient to be evaluated for diabetic shoes and insoles through our office and he declined at this time -Advised topical pain creams or rub as needed -Patient to return to office as needed or sooner if condition worsens.  Advised patient if symptoms fail to improve may benefit from further work-up for neuropathy which could be related to small fiber or early in nature that can give very similar symptoms  Landis Martins, DPM

## 2023-06-19 ENCOUNTER — Ambulatory Visit: Payer: Medicare Other | Admitting: Podiatry

## 2023-06-19 ENCOUNTER — Encounter: Payer: Self-pay | Admitting: Podiatry

## 2023-06-19 DIAGNOSIS — B351 Tinea unguium: Secondary | ICD-10-CM | POA: Diagnosis not present

## 2023-06-19 NOTE — Progress Notes (Signed)
Chief Complaint  Patient presents with   Nail Problem    Left 2nd toenail thick and discolored. Patient inquiring about nail fungus treatment or if removing the nail permanently is the best option.    HPI: 67 y.o. diabetic male presents today noting a fungal toenail to the left second toe.  He states that he had the bilateral hallux nails permanently removed in the past.  Patient had stated that he did well with an antifungal medication in the past and from the description it sounded as though it was the itraconazole pulsed dosing that had been prescribed.  He notes that he has had liver function test in the recent past.  States that it was "normal".  He then stated at one point it was mildly elevated, but then the repeat study was normal.  The last labs that are in the [facility] health epic system are from 2019.  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   Chronic kidney disease 2016   bil renal mass, CKD stage 3   Colon polyp    Diabetes mellitus without complication (HCC)    type II   Dyslipidemia    Erectile dysfunction    Hypertension    Hypogonadism in male    Morbid obesity (HCC)    Non-traumatic rhabdomyolysis    during surgery   Renal cyst 2016   bilateral   Respiratory failure (HCC) 06/20/2017   Seizures (HCC) 06/2017   tonic-clonic, simple partial seizure, 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 Jennye Boroughs   CYSTOSCOPY WITH RETROGRADE PYELOGRAM, URETEROSCOPY AND STENT PLACEMENT Right  05/02/2016   Procedure: CYSTO WITH BILATERAL RETROGRADE PYELOGRAM, RIGHT URETEROSCOPY, RIGHT URETERAL DILATION, RIGHT STENT PLACEMENT,  AND BRUSH BIOPSY;  Surgeon: Crist Fat, MD;  Location: [facility];  Service: Urology;  Laterality: Right;   left GSV laser ablation     PILONIDAL CYST EXCISION  1998   ROBOTIC ASSITED PARTIAL NEPHRECTOMY Right 09/07/2015   Procedure: RIGHT ROBOTIC ASSITED PARTIAL NEPHRECTOMY;  Surgeon: Crist Fat, MD;  Location: [facility];  Service: Urology;  Laterality: Right;   TONSILLECTOMY AND ADENOIDECTOMY  1964   TOTAL KNEE ARTHROPLASTY Right 10/22/2017   Procedure: RIGHT TOTAL KNEE ARTHROPLASTY;  Surgeon: Jene Every, MD;  Location: [facility];  Service: Orthopedics;  Laterality: Right;  Adductor Block   TOTAL KNEE ARTHROPLASTY Left 12/31/2017   Procedure: LEFT TOTAL KNEE ARTHROPLASTY;  Surgeon: Jene Every, MD;  Location: [facility];  Service: Orthopedics;  Laterality: Left;  2.5 hrs with block   No Known Allergies   Physical Exam: There were no vitals filed for this visit.  General: The patient is alert and oriented x3 in no acute distress.  Dermatology: Skin is warm, dry and supple bilateral lower extremities. Interspaces are clear of maceration and debris.  The hallux nails have been  previously removed.  The left second toenail is 3 mm thick with yellow and brown discoloration only along the distal 50%, subungual debris, minor distal onycholysis and pain with compression.  There is no surrounding erythema or edema noted.  All of the remaining toenails are within normal limits and do not appear to have any fungal involvement.  Vascular: Palpable pedal pulses bilaterally. Capillary refill within normal limits. No erythema or calor to the toes.  Neurological: Light touch sensation grossly intact bilateral feet.   Assessment/Plan of Care: 1. Dermatophytosis of nail    Discussed clinical findings with patient today.  Discussed starting the patient on  itraconazole since he expressed that this worked well for him in the past.  The proximal 50% of the nail appears clear of fungal involvement, so the oral itraconazole therapy should work well for him for this toenail.  He was agreeable to this.  Informed him that the left second toenail was going to be debrided to decrease the thickness and make the nail more comfortable for him.  Also informed the patient we could either have him request his recent liver function test results be faxed to our office at his earliest convenience or obtain a new hepatic function panel for my review in order to prescribe the itraconazole.  Patient began questioning why this was necessary since his other specialist is able to see his blood work in the computer system.  I did inform him the last blood work we are able to access is from 2019, and we would need normal blood work results from 2024 in order to proceed with prescribing the itraconazole.  If he was able to pull the results from this year upon his phone, we can also use that to confirm normal hepatic function.  He was provided our office fax number to have the results sent to our office.  The left second toenail was sanded down with the use of a power bur uneventfully.    Upon exiting the exam room, the patient then asked what was going to be done to the left second toenail.  Informed the patient that the nail was just debrided and we had spent the entire appointment discussing starting him on the itraconazole he had taken in the past that he expressed worked well for him and had been amenable to starting order to treat the fungus.  He then stated he made the appointment to have the toenail removed.  The patient was asked if he had intended to have the toenail removed permanently like his hallux nails.  He replied in the affirmative.  The patient was then asked why he continued with a discussion of starting antifungal therapy if he was only interested in having the toenail  permanently removed which had not been mentioned the entire appointment with me directly.  Informed the patient that he did not need to go on any type of antifungal therapy for any of the other toenails as the second toenail was the only one that was involved.  The patient then started to get visibly frustrated.  He was scrolling on his cell phone during the appointment, and perhaps there could have been miscommunication because he might have been distracted during the encounter.  Informed the patient that we could schedule an appointment to remove the toenail but at this point the current appointment was over and an additional procedure would not be able to be accommodated today.  He then stopped talking and got out of the exam chair  to leave.  Follow-up on an as-needed basis at this point  Clerance Lav, DPM, FACFAS [facility]     [address] [place], [place] [address]                Office [phone]  Fax [phone]
